# Patient Record
Sex: Female | Born: 1975 | Race: Asian | Hispanic: No | Marital: Married | Smoking: Never smoker
Health system: Southern US, Community
[De-identification: ages and names within clinical notes are randomized; demographics above are authoritative.]

## PROBLEM LIST (undated history)

## (undated) ENCOUNTER — Inpatient Hospital Stay (HOSPITAL_COMMUNITY): Payer: Self-pay

## (undated) DIAGNOSIS — Z789 Other specified health status: Secondary | ICD-10-CM

## (undated) DIAGNOSIS — R079 Chest pain, unspecified: Secondary | ICD-10-CM

## (undated) DIAGNOSIS — O24419 Gestational diabetes mellitus in pregnancy, unspecified control: Secondary | ICD-10-CM

## (undated) HISTORY — PX: DILATION AND CURETTAGE OF UTERUS: SHX78

## (undated) HISTORY — DX: Chest pain, unspecified: R07.9

---

## 2012-09-29 ENCOUNTER — Inpatient Hospital Stay (HOSPITAL_COMMUNITY)
Admission: AD | Admit: 2012-09-29 | Discharge: 2012-09-29 | Disposition: A | Discharge status: Home / Self Care | Payer: Medicaid Other | Source: Ambulatory Visit | Attending: Obstetrics & Gynecology | Admitting: Obstetrics & Gynecology

## 2012-09-29 ENCOUNTER — Inpatient Hospital Stay (HOSPITAL_COMMUNITY): Payer: Medicaid Other

## 2012-09-29 ENCOUNTER — Encounter (HOSPITAL_COMMUNITY): Payer: Self-pay | Admitting: *Deleted

## 2012-09-29 ENCOUNTER — Inpatient Hospital Stay (HOSPITAL_COMMUNITY)
Admission: AD | Admit: 2012-09-29 | Discharge: 2012-09-30 | Disposition: A | Discharge status: Home / Self Care | Payer: Medicaid Other | Source: Ambulatory Visit | Attending: Obstetrics & Gynecology | Admitting: Obstetrics & Gynecology

## 2012-09-29 ENCOUNTER — Encounter (HOSPITAL_COMMUNITY): Payer: Self-pay

## 2012-09-29 DIAGNOSIS — R1031 Right lower quadrant pain: Secondary | ICD-10-CM | POA: Insufficient documentation

## 2012-09-29 DIAGNOSIS — R1032 Left lower quadrant pain: Secondary | ICD-10-CM | POA: Insufficient documentation

## 2012-09-29 DIAGNOSIS — R109 Unspecified abdominal pain: Secondary | ICD-10-CM | POA: Insufficient documentation

## 2012-09-29 DIAGNOSIS — O209 Hemorrhage in early pregnancy, unspecified: Secondary | ICD-10-CM | POA: Insufficient documentation

## 2012-09-29 DIAGNOSIS — O039 Complete or unspecified spontaneous abortion without complication: Secondary | ICD-10-CM

## 2012-09-29 DIAGNOSIS — O210 Mild hyperemesis gravidarum: Secondary | ICD-10-CM | POA: Insufficient documentation

## 2012-09-29 DIAGNOSIS — O26859 Spotting complicating pregnancy, unspecified trimester: Secondary | ICD-10-CM | POA: Insufficient documentation

## 2012-09-29 DIAGNOSIS — O4690 Antepartum hemorrhage, unspecified, unspecified trimester: Secondary | ICD-10-CM

## 2012-09-29 LAB — WET PREP, GENITAL: Yeast Wet Prep HPF POC: NONE SEEN

## 2012-09-29 LAB — URINALYSIS, ROUTINE W REFLEX MICROSCOPIC
Ketones, ur: NEGATIVE mg/dL
Protein, ur: NEGATIVE mg/dL
Urobilinogen, UA: 0.2 mg/dL (ref 0.0–1.0)

## 2012-09-29 LAB — CBC
HCT: 34.7 % — ABNORMAL LOW (ref 36.0–46.0)
HCT: 34.4 % — ABNORMAL LOW (ref 36.0–46.0)
Hemoglobin: 11.8 g/dL — ABNORMAL LOW (ref 12.0–15.0)
MCH: 31.6 pg (ref 26.0–34.0)
MCHC: 34.3 g/dL (ref 30.0–36.0)
MCV: 92 fL (ref 78.0–100.0)
MCV: 92 fL (ref 78.0–100.0)
Platelets: 269 10*3/uL (ref 150–400)
RBC: 3.74 MIL/uL — ABNORMAL LOW (ref 3.87–5.11)
RDW: 11.9 % (ref 11.5–15.5)
WBC: 4.8 10*3/uL (ref 4.0–10.5)

## 2012-09-29 LAB — URINE MICROSCOPIC-ADD ON

## 2012-09-29 MED ORDER — ONDANSETRON HCL 4 MG/2ML IJ SOLN
4.0000 mg | Freq: Once | INTRAMUSCULAR | Status: AC
  Administered 2012-09-30: 4 mg via INTRAVENOUS
  Filled 2012-09-29: qty 2

## 2012-09-29 MED ORDER — SODIUM CHLORIDE 0.9 % IV SOLN
INTRAVENOUS | Status: DC
  Administered 2012-09-30: via INTRAVENOUS

## 2012-09-29 NOTE — Progress Notes (Signed)
Pt became nauseous after pelvic exam

## 2012-09-29 NOTE — MAU Note (Addendum)
Patient is in with c/o vaginal bleeding (dark brown) for 3 days. She is tearfu and concerned because she had a 7 weeks miscarriage last year. She denies any pain. Patient does not have a pad on, she states that she see the blood after voiding.

## 2012-09-29 NOTE — MAU Note (Signed)
Patient states she has had brown spotting for several days. [facility]. Denies any pain.

## 2012-09-29 NOTE — MAU Note (Signed)
Pt states bleeding became worse today about 1900.

## 2012-09-29 NOTE — MAU Provider Note (Signed)
History     CSN: [id number]  Arrival date and time: 09/29/12 1053   First Provider Initiated Contact with Patient 09/29/12 1144      Chief Complaint  Patient presents with  . Vaginal Bleeding   HPI  Savannah Edwards is a 36 y.o. G2P0010 who is [redacted]w[redacted]d presenting with vaginal spotting that started 3 days ago. She has intermittent RLQ and LLQ cramping that is relieved with rest. Denies vaginal irritation or discharge, urinary symptoms, fever. She had a miscarriage with her previous pregnancy at [redacted] weeks gestation.    History reviewed. No pertinent past medical history.  History reviewed. No pertinent past surgical history.  History reviewed. No pertinent family history.  History  Substance Use Topics  . Smoking status: Never Smoker   . Smokeless tobacco: Not on file  . Alcohol Use: No    Allergies: No Known Allergies  Prescriptions prior to admission  Medication Sig Dispense Refill  . Prenatal Vit-Fe Fumarate-FA (PRENATAL MULTIVITAMIN) TABS Take 1 tablet by mouth daily.        Review of Systems  Constitutional: Negative for fever and chills.  Gastrointestinal: Positive for nausea, vomiting and abdominal pain. Negative for diarrhea and blood in stool.  Genitourinary: Positive for frequency. Negative for dysuria, urgency, hematuria and flank pain.  Neurological: Negative for weakness and headaches.   Physical Exam   Blood pressure 127/86, pulse 98, temperature 98.5 F (36.9 C), temperature source Oral, resp. rate 16, height 5' (1.524 m), weight 54.795 kg (120 lb 12.8 oz), last menstrual period 08/19/2012, SpO2 100.00%.  Physical Exam  Constitutional: She is oriented to person, place, and time. She appears well-developed and well-nourished. No distress.  Cardiovascular: Normal rate, regular rhythm and normal heart sounds.   Respiratory: Effort normal and breath sounds normal.  GI: Soft. Bowel sounds are normal. She exhibits no mass. There is no tenderness. There is no  rebound.  Genitourinary: Vagina normal and uterus normal. Uterus is not tender. Cervix exhibits discharge (bloody). Right adnexum displays no tenderness. Left adnexum displays no tenderness. No tenderness around the vagina. No vaginal discharge found.  Neurological: She is alert and oriented to person, place, and time.  Skin: Skin is warm and dry. She is not diaphoretic.  Psychiatric: She has a normal mood and affect. Her behavior is normal.    MAU Course  Procedures  Results for orders placed during the hospital encounter of 09/29/12 (from the past 24 hour(s))  POCT PREGNANCY, URINE     Status: Abnormal   Collection Time   09/29/12 11:29 AM      Component Value Range   Preg Test, Ur POSITIVE (*) NEGATIVE  CBC     Status: Abnormal   Collection Time   09/29/12 11:50 AM      Component Value Range   WBC 4.8  4.0 - 10.5 K/uL   RBC 3.77 (*) 3.87 - 5.11 MIL/uL   Hemoglobin 11.9 (*) 12.0 - 15.0 g/dL   HCT 40.9 (*) 81.1 - 91.4 %   MCV 92.0  78.0 - 100.0 fL   MCH 31.6  26.0 - 34.0 pg   MCHC 34.3  30.0 - 36.0 g/dL   RDW 78.2  95.6 - 21.3 %   Platelets 242  150 - 400 K/uL  ABO/RH     Status: Normal   Collection Time   09/29/12 11:50 AM      Component Value Range   ABO/RH(D) B POS    HCG, QUANTITATIVE, PREGNANCY  Status: Abnormal   Collection Time   09/29/12 11:50 AM      Component Value Range   hCG, Beta Chain, Quant, S 67 (*) <5 mIU/mL   Radiology report: no IUP or adenaxal mass identified.  Wet prep- pending; will review with pt when returns for HCG on 11/29 Assessment and Plan  IUP too early to identify vs anembryonic pregnancy vs SAB vs ectopic pregnancy  Return on Thursday morning to repeat labs Return sooner for increased bleeding or abdominal pain  Corky Downs 09/29/2012, 11:50 AM  I examined pt  And agree with above note

## 2012-09-30 LAB — GC/CHLAMYDIA PROBE AMP: CT Probe RNA: NEGATIVE

## 2012-09-30 MED ORDER — IBUPROFEN 600 MG PO TABS: 600.0000 mg | ORAL_TABLET | Freq: Once | ORAL | Status: DC

## 2012-09-30 MED ORDER — IBUPROFEN 600 MG PO TABS: 600.0000 mg | ORAL_TABLET | Freq: Four times a day (QID) | ORAL | Status: DC | PRN

## 2012-09-30 NOTE — MAU Note (Signed)
Pt tearful, second miscarriage, pt asking about reasons for miscarrying

## 2012-09-30 NOTE — MAU Provider Note (Signed)
History     CSN: [id number]  Arrival date and time: 09/29/12 2312   First Provider Initiated Contact with Patient 09/29/12 2354      Chief Complaint  Patient presents with  . Vaginal Bleeding  . Abdominal Pain   HPI Savannah Edwards is a 36 y.o. female who presents to MAU with vaginal bleeding.  She was evaluated here earlier today and had a Bhcg that was 67. She went home and just prior to arrival to MAU began having heavy bleeding and severe cramping. Initially the pain was 8/10 but now 2/10. The pain comes and goes. She also has nausea and vomiting. The history was provided by the patient.  OB History    Grav Para Term Preterm Abortions TAB SAB Ect Mult Living   2    1  1          History reviewed. No pertinent past medical history.  History reviewed. No pertinent past surgical history.  Family History  Problem Relation Age of Onset  . Alcohol abuse Neg Hx   . Arthritis Neg Hx   . Asthma Neg Hx   . Birth defects Neg Hx   . Cancer Neg Hx   . COPD Neg Hx   . Depression Neg Hx   . Diabetes Neg Hx   . Drug abuse Neg Hx   . Early death Neg Hx   . Hearing loss Neg Hx   . Heart disease Neg Hx   . Hyperlipidemia Neg Hx   . Hypertension Neg Hx   . Kidney disease Neg Hx   . Learning disabilities Neg Hx   . Mental illness Neg Hx   . Mental retardation Neg Hx   . Miscarriages / Stillbirths Neg Hx   . Stroke Neg Hx   . Vision loss Neg Hx     History  Substance Use Topics  . Smoking status: Never Smoker   . Smokeless tobacco: Not on file  . Alcohol Use: No    Allergies: No Known Allergies  Prescriptions prior to admission  Medication Sig Dispense Refill  . Prenatal Vit-Fe Fumarate-FA (PRENATAL MULTIVITAMIN) TABS Take 1 tablet by mouth daily.        ROS: as stated in HIP Physical Exam   Blood pressure 110/81, pulse 91, temperature 97.8 F (36.6 C), temperature source Oral, resp. rate 18, last menstrual period 08/19/2012, SpO2 100.00%.  Physical Exam  Nursing  note and vitals reviewed. Constitutional: She is oriented to person, place, and time. She appears well-developed and well-nourished. No distress.  HENT:  Head: Normocephalic and atraumatic.  Eyes: EOM are normal.  Neck: Neck supple.  Cardiovascular: Normal rate.   Respiratory: Effort normal.  GI: Soft. There is no tenderness.       Unable to reproduce the pain that the patient has had.  Genitourinary:       External genitalia without lesions. Small blood vaginal vault. Cervix closed, long, no CMT, no adnexal tenderness. Uterus without palpable enlargement.   Musculoskeletal: Normal range of motion.  Neurological: She is alert and oriented to person, place, and time.  Skin: Skin is warm and dry.  Psychiatric: She has a normal mood and affect. Her behavior is normal. Judgment and thought content normal.   Results for orders placed during the hospital encounter of 09/29/12 (from the past 24 hour(s))  CBC     Status: Abnormal   Collection Time   09/29/12 11:25 PM      Component Value Range   WBC  7.7  4.0 - 10.5 K/uL   RBC 3.74 (*) 3.87 - 5.11 MIL/uL   Hemoglobin 11.8 (*) 12.0 - 15.0 g/dL   HCT 14.7 (*) 82.9 - 56.2 %   MCV 92.0  78.0 - 100.0 fL   MCH 31.6  26.0 - 34.0 pg   MCHC 34.3  30.0 - 36.0 g/dL   RDW 13.0 (*) 86.5 - 78.4 %   Platelets 269  150 - 400 K/uL  HCG, QUANTITATIVE, PREGNANCY     Status: Abnormal   Collection Time   09/29/12 11:25 PM      Component Value Range   hCG, Beta Chain, Quant, S 43 (*) <5 mIU/mL   Assessment: 36 y.o. female with vaginal bleeding and cramping in early pregnancy   Drop in Bhcg   SAB   Nausea and vomiting   Plan:  IV fluids   Zofran 4 mg IV   Follow up in 48 hours, return sooner for problems. MAU Course: discussed with Dr. Debroah Loop  Procedures Discussed with the patient and all questioned fully answered. She will return if any problems arise.   Medication List     As of 09/30/2012 12:41 AM    CONTINUE taking these medications          prenatal multivitamin Tabs         NEESE,HOPE, RN, FNP, [facility] 09/30/2012, 12:28 AM

## 2012-09-30 NOTE — MAU Note (Signed)
Pt very tearful still has questions about why she is having 2nd miscarriage

## 2012-10-02 ENCOUNTER — Encounter (HOSPITAL_COMMUNITY): Payer: Self-pay | Admitting: *Deleted

## 2012-10-02 ENCOUNTER — Inpatient Hospital Stay (HOSPITAL_COMMUNITY)
Admission: AD | Admit: 2012-10-02 | Discharge: 2012-10-02 | Disposition: A | Discharge status: Home / Self Care | Payer: Medicaid Other | Source: Ambulatory Visit | Attending: Family Medicine | Admitting: Family Medicine

## 2012-10-02 DIAGNOSIS — O039 Complete or unspecified spontaneous abortion without complication: Secondary | ICD-10-CM | POA: Insufficient documentation

## 2012-10-02 HISTORY — DX: Other specified health status: Z78.9

## 2012-10-02 NOTE — MAU Provider Note (Signed)
  History     CSN: [id number]  Arrival date and time: 10/02/12 1652   None     No chief complaint on file.  HPI This is a 36 y.o. female at [redacted]w[redacted]d by LMP who presents for followup Quant HCG.  She was seen twice on the 26th for bleeding. Her Quant HCG levels dropped the same day from 67 to 43.  She also had a miscarriage a year ago.                       OB History    Grav Para Term Preterm Abortions TAB SAB Ect Mult Living   2    1  1          No past medical history on file.  No past surgical history on file.  Family History  Problem Relation Age of Onset  . Alcohol abuse Neg Hx   . Arthritis Neg Hx   . Asthma Neg Hx   . Birth defects Neg Hx   . Cancer Neg Hx   . COPD Neg Hx   . Depression Neg Hx   . Diabetes Neg Hx   . Drug abuse Neg Hx   . Early death Neg Hx   . Hearing loss Neg Hx   . Heart disease Neg Hx   . Hyperlipidemia Neg Hx   . Hypertension Neg Hx   . Kidney disease Neg Hx   . Learning disabilities Neg Hx   . Mental illness Neg Hx   . Mental retardation Neg Hx   . Miscarriages / Stillbirths Neg Hx   . Stroke Neg Hx   . Vision loss Neg Hx     History  Substance Use Topics  . Smoking status: Never Smoker   . Smokeless tobacco: Not on file  . Alcohol Use: No    Allergies: No Known Allergies  Prescriptions prior to admission  Medication Sig Dispense Refill  . ibuprofen (ADVIL,MOTRIN) 600 MG tablet Take 1 tablet (600 mg total) by mouth every 6 (six) hours as needed for pain.  30 tablet  0  . Prenatal Vit-Fe Fumarate-FA (PRENATAL MULTIVITAMIN) TABS Take 1 tablet by mouth daily.        ROS Negative  Physical Exam   Last menstrual period 08/19/2012.  Physical Exam  Constitutional: She is oriented to person, place, and time. She appears well-developed and well-nourished. No distress.  Neurological: She is alert and oriented to person, place, and time.  Skin: Skin is warm and dry.  Psychiatric: She has a normal mood and affect.   Exam not  indicated  MAU Course  Procedures  MDM Results for orders placed during the hospital encounter of 10/02/12 (from the past 24 hour(s))  HCG, QUANTITATIVE, PREGNANCY     Status: Abnormal   Collection Time   10/02/12  5:02 PM      Component Value Range   hCG, Beta Chain, Quant, S 9 (*) <5 mIU/mL     Assessment and Plan  A:  SAB with declining quant HCG levels  P:  Discharge      Follow up in clinic      Discussed we usually dont do a full workup until 3 miscarriages..will discuss inclinic      Continue PNV  [facility] 10/02/2012, 5:05 PM

## 2012-10-02 NOTE — MAU Note (Signed)
Doing ok, still bleeding,only time she has pain is when she passes a clot.

## 2012-10-02 NOTE — MAU Provider Note (Signed)
Chart reviewed and agree with management and plan.  

## 2012-10-05 NOTE — MAU Provider Note (Signed)
Attestation of Attending Supervision of Advanced Practitioner (CNM/NP): Evaluation and management procedures were performed by the Advanced Practitioner under my supervision and collaboration.  I have reviewed the Advanced Practitioner's note and chart, and I agree with the management and plan.  HARRAWAY-SMITH, Malina Geers 10:38 AM     

## 2013-02-02 ENCOUNTER — Telehealth: Payer: Self-pay | Admitting: *Deleted

## 2013-02-02 DIAGNOSIS — B9689 Other specified bacterial agents as the cause of diseases classified elsewhere: Secondary | ICD-10-CM

## 2013-02-02 MED ORDER — TINIDAZOLE 500 MG PO TABS: 1000.0000 mg | ORAL_TABLET | Freq: Every day | ORAL | Status: AC

## 2013-02-02 NOTE — Telephone Encounter (Signed)
Patient was notified by office of lab results- language resources called for pharmacy preference. [facility] to be called to pharmacy for patient.

## 2013-07-19 ENCOUNTER — Ambulatory Visit (INDEPENDENT_AMBULATORY_CARE_PROVIDER_SITE_OTHER): Payer: Medicaid Other | Admitting: Obstetrics

## 2013-07-19 ENCOUNTER — Encounter: Payer: Self-pay | Admitting: Obstetrics

## 2013-07-19 VITALS — BP 105/71 | HR 77 | Temp 98.7°F | Ht 61.0 in | Wt 124.0 lb

## 2013-07-19 DIAGNOSIS — O2620 Pregnancy care for patient with recurrent pregnancy loss, unspecified trimester: Secondary | ICD-10-CM

## 2013-07-19 NOTE — Progress Notes (Signed)
Subjective:    Savannah Edwards is a 37 y.o. female who presents for evaluation of infertility. Patient and partner have been attempting conception for 8-9 months  Marital status: married for 10 years. Pregnancies with current partner: yes.  Menstrual and Endocrine History LMP Patient's last menstrual period was 06/28/2012.  Menarche age 61-14?  Shortest interval 30  Longest interval not applicable  days  Duration of flow 4 days  Heavy menses yes  Clots yes  Intermenstrual bleeding no  Postcoital bleeding no  Dysmenorrhea yes  Amenorrhea not applicable  Weight change no  Hirsutism no  Balding yes  Acne yes  Galactorrhea yes   Obstetrical History History: history of 3 miscarriages with most recent November 2013  Gynecologic History Last PAP Unknown  Previous abdominal or pelvic surgery D & C with second miscarriage  Pelvic pain no  Endometriosis no  Hot flashes no  DES exposure no  Abnormal Pap no  Cervix Cryo/cone no  Sexually transmitted diseases no  Pelvic inflammatory disease no   Infertility and Endocrine Studies Basal body temperature no  Endo with biopsy no  Hysterosalpingogram no  Post-coital test no  Laparoscopy no  Hormonal studies no  Semen analysis no  Other studies no  Medications none  Other therapies Not applicable  Insemination not applicable   Sexual History Frequency 3 times per week  Satisfied yes  Dyspareunia no  Use of lubricant no  Douching no  Number of lifetime sex partners 1   Contraception None  Family History Thyroid problems  no  Heart condition or high blood pressure  yes  Blood clot or stroke  no  Diabetes  no  Cancer  no  Birth defects/inherited diseases  no  Infectious diseases (mumps, TB, rubella)  no  Other medical problems  no   Habits Cigarettes:    Wife -  no    Husband - no Alcohol:    Wife -  no    Husband - yes, 10 -14 drinks per week for 19 years Marijuana:   Wife - no   Husband - no  The following  portions of the patient's history were reviewed and updated as appropriate: allergies, current medications, past family history, past medical history, past social history, past surgical history and problem list.  Review of Systems Pertinent items are noted in HPI.  Female History and Exam Name:   Renelda Mom Age: 52 Education: 2 YR Degree  Marital History: married # of years with this partner: 43 # of partners: Unknown  Paternity of Pregnancies: Number with this partner: 0 Number with other partners: 0 Age of youngest child: not applicable  Urologic History: Infection no  STD no  Mumps no  Varicocele no  Semen analysis no  Undescended testes no  Testicular trauma no  Genital surgery no  Ejaculatory problem no  Impotence no         Objective:    Female Exam BP 105/71  Pulse 77  Temp(Src) 98.7 F (37.1 C) (Oral)  Ht 5\' 1"  (1.549 m)  Wt 124 lb (56.246 kg)  BMI 23.44 kg/m2  LMP 06/28/2012  Breastfeeding? No Wt Readings from Last 1 Encounters:  07/19/13 124 lb (56.246 kg)   BMI: Body mass index is 23.44 kg/(m^2). No exam performed today, Infertility consultation visit..    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   Plan:    All questions answered. Referred to Reproductive Endocrinology for recurrent pregnancy losses.

## 2013-07-20 ENCOUNTER — Encounter: Payer: Self-pay | Admitting: Obstetrics

## 2013-08-09 ENCOUNTER — Encounter: Payer: Self-pay | Admitting: Obstetrics

## 2013-11-04 NOTE — L&D Delivery Note (Signed)
Delivery Note At 12:45 AM a viable female was delivered via Vaginal, Vacuum (Extractor) because of OP position and maternal exhaustion. (Presentation: Left Occiput Posterior).  APGAR: 9, 9; weight:  2809 grams .   Placenta status: Intact, Spontaneous.  Cord:  with the following complications: None.  Cord pH: none  Anesthesia: Epidural  Episiotomy: Midline Lacerations: 2nd degree Suture Repair: 2.0 chromic vicryl Est. Blood Loss (mL): 400  Mom to postpartum.  Baby to Couplet care / Skin to Skin.  Nettie Wyffels A 07/09/2014, 1:23 AM

## 2013-11-12 ENCOUNTER — Encounter: Payer: Medicaid Other | Admitting: Advanced Practice Midwife

## 2013-11-17 ENCOUNTER — Encounter: Payer: Self-pay | Admitting: Obstetrics

## 2013-11-17 ENCOUNTER — Ambulatory Visit (INDEPENDENT_AMBULATORY_CARE_PROVIDER_SITE_OTHER): Payer: 59 | Admitting: Obstetrics

## 2013-11-17 VITALS — BP 98/73 | Temp 97.4°F | Wt 130.0 lb

## 2013-11-17 DIAGNOSIS — Z3400 Encounter for supervision of normal first pregnancy, unspecified trimester: Secondary | ICD-10-CM

## 2013-11-17 DIAGNOSIS — Z3201 Encounter for pregnancy test, result positive: Secondary | ICD-10-CM

## 2013-11-17 DIAGNOSIS — O09529 Supervision of elderly multigravida, unspecified trimester: Secondary | ICD-10-CM | POA: Insufficient documentation

## 2013-11-17 DIAGNOSIS — O2620 Pregnancy care for patient with recurrent pregnancy loss, unspecified trimester: Secondary | ICD-10-CM

## 2013-11-17 DIAGNOSIS — Z113 Encounter for screening for infections with a predominantly sexual mode of transmission: Secondary | ICD-10-CM

## 2013-11-17 LAB — POCT URINALYSIS DIPSTICK
Bilirubin, UA: NEGATIVE
Glucose, UA: NEGATIVE
Ketones, UA: NEGATIVE
NITRITE UA: NEGATIVE
PH UA: 5
RBC UA: NEGATIVE
Spec Grav, UA: 1.02
Urobilinogen, UA: NEGATIVE

## 2013-11-17 LAB — OB RESULTS CONSOLE GC/CHLAMYDIA
CHLAMYDIA, DNA PROBE: NEGATIVE
Gonorrhea: NEGATIVE

## 2013-11-17 NOTE — Progress Notes (Signed)
P 84 Patient states she had some spotting after intercourse last week - but nothing since. Patient is very concerned about her pregnancy and her history of miscarriage Subjective:    Savannah Edwards is being seen today for her first obstetrical visit.  This is a planned pregnancy. She is at 6956w6d gestation. Her obstetrical history is significant for advanced maternal age and poor pregnancy history. Relationship with FOB: spouse, living together. Patient does intend to breast feed. Pregnancy history fully reviewed.  Menstrual History: OB History   Grav Para Term Preterm Abortions TAB SAB Ect Mult Living   4    3  3          Menarche age: [age] Patient's last menstrual period was 09/23/2013.    The following portions of the patient's history were reviewed and updated as appropriate: allergies, current medications, past family history, past medical history, past social history, past surgical history and problem list.  Review of Systems Pertinent items are noted in HPI.    Objective:    General appearance: alert and no distress Abdomen: normal findings: soft, non-tender Pelvic: cervix normal in appearance, external genitalia normal, no adnexal masses or tenderness, no cervical motion tenderness, rectovaginal septum normal, uterus normal size, shape, and consistency and vagina normal without discharge Extremities: extremities normal, atraumatic, no cyanosis or edema    Assessment:    Pregnancy at 3656w6d weeks   Habitual Aborter   Plan:    Initial labs drawn. Prenatal vitamins.  Counseling provided regarding continued use of seat belts, cessation of alcohol consumption, smoking or use of illicit drugs; infection precautions i.e., influenza/TDAP immunizations, toxoplasmosis,CMV, parvovirus, listeria and varicella; workplace safety, exercise during pregnancy; routine dental care, safe medications, sexual activity, hot tubs, saunas, pools, travel, caffeine use, fish and methlymercury, potential  toxins, hair treatments, varicose veins Weight gain recommendations per IOM guidelines reviewed: underweight/BMI< 18.5--> gain 28 - 40 lbs; normal weight/BMI 18.5 - 24.9--> gain 25 - 35 lbs; overweight/BMI 25 - 29.9--> gain 15 - 25 lbs; obese/BMI >30->gain  11 - 20 lbs Problem list reviewed and updated. FIRST/CF mutation testing/NIPT/QUAD SCREEN discussed: requested. Role of ultrasound in pregnancy discussed; fetal survey: requested. Amniocentesis discussed: undecided. VBAC calculator score: VBAC consent form provided Follow up in 2 weeks. 50% of 20 min visit spent on counseling and coordination of care.  Referred to MFM. Start Progesterone suppositories after ultrasound confirms viable fetus. Draw antiphospholipid labs today. .Marland Kitchen

## 2013-11-18 ENCOUNTER — Encounter: Payer: Self-pay | Admitting: Obstetrics

## 2013-11-18 LAB — OBSTETRIC PANEL
ANTIBODY SCREEN: NEGATIVE
BASOS ABS: 0 10*3/uL (ref 0.0–0.1)
BASOS PCT: 0 % (ref 0–1)
Eosinophils Absolute: 0 10*3/uL (ref 0.0–0.7)
Eosinophils Relative: 1 % (ref 0–5)
HCT: 37.7 % (ref 36.0–46.0)
HEP B S AG: NEGATIVE
Hemoglobin: 12.9 g/dL (ref 12.0–15.0)
Lymphocytes Relative: 26 % (ref 12–46)
Lymphs Abs: 2 10*3/uL (ref 0.7–4.0)
MCH: 31.8 pg (ref 26.0–34.0)
MCHC: 34.2 g/dL (ref 30.0–36.0)
MCV: 92.9 fL (ref 78.0–100.0)
Monocytes Absolute: 0.4 10*3/uL (ref 0.1–1.0)
Monocytes Relative: 5 % (ref 3–12)
NEUTROS ABS: 5.3 10*3/uL (ref 1.7–7.7)
NEUTROS PCT: 68 % (ref 43–77)
PLATELETS: 313 10*3/uL (ref 150–400)
RBC: 4.06 MIL/uL (ref 3.87–5.11)
RDW: 13.8 % (ref 11.5–15.5)
Rh Type: POSITIVE
Rubella: 5.36 Index — ABNORMAL HIGH (ref <0.90)
WBC: 7.7 10*3/uL (ref 4.0–10.5)

## 2013-11-18 LAB — CULTURE, OB URINE
Colony Count: NO GROWTH
ORGANISM ID, BACTERIA: NO GROWTH

## 2013-11-18 LAB — VITAMIN D 25 HYDROXY (VIT D DEFICIENCY, FRACTURES): VIT D 25 HYDROXY: 25 ng/mL — AB (ref 30–89)

## 2013-11-18 LAB — WET PREP BY MOLECULAR PROBE
Candida species: NEGATIVE
Gardnerella vaginalis: NEGATIVE
Trichomonas vaginosis: NEGATIVE

## 2013-11-18 LAB — BETA-2 GLYCOPROTEIN ANTIBODIES
BETA 2 GLYCO I IGG: 3 G Units (ref <20)
BETA-2-GLYCOPROTEIN I IGM: 36 M Units — AB (ref <20)
Beta-2-Glycoprotein I IgA: 15 A Units (ref <20)

## 2013-11-18 LAB — GC/CHLAMYDIA PROBE AMP
CT PROBE, AMP APTIMA: NEGATIVE
GC Probe RNA: NEGATIVE

## 2013-11-18 LAB — CARDIOLIPIN ANTIBODIES, IGG, IGM, IGA
ANTICARDIOLIPIN IGG: 16 GPL U/mL (ref <23)
ANTICARDIOLIPIN IGM: 9 [MPL'U]/mL (ref <11)
Anticardiolipin IgA: 8 APL U/mL (ref <22)

## 2013-11-18 LAB — HIV ANTIBODY (ROUTINE TESTING W REFLEX): HIV: NONREACTIVE

## 2013-11-19 LAB — HEMOGLOBINOPATHY EVALUATION
HEMOGLOBIN OTHER: 0 %
Hgb A2 Quant: 2.4 % (ref 2.2–3.2)
Hgb A: 97.6 % (ref 96.8–97.8)
Hgb F Quant: 0 % (ref 0.0–2.0)
Hgb S Quant: 0 %

## 2013-11-24 ENCOUNTER — Other Ambulatory Visit: Payer: Self-pay | Admitting: Obstetrics

## 2013-11-24 DIAGNOSIS — O2620 Pregnancy care for patient with recurrent pregnancy loss, unspecified trimester: Secondary | ICD-10-CM

## 2013-11-24 DIAGNOSIS — O09529 Supervision of elderly multigravida, unspecified trimester: Secondary | ICD-10-CM

## 2013-11-25 ENCOUNTER — Encounter: Payer: Self-pay | Admitting: Obstetrics & Gynecology

## 2013-11-26 ENCOUNTER — Ambulatory Visit (HOSPITAL_COMMUNITY)
Admission: RE | Admit: 2013-11-26 | Discharge: 2013-11-26 | Disposition: A | Discharge status: Home / Self Care | Payer: 59 | Source: Ambulatory Visit | Attending: Obstetrics | Admitting: Obstetrics

## 2013-11-26 DIAGNOSIS — O2620 Pregnancy care for patient with recurrent pregnancy loss, unspecified trimester: Secondary | ICD-10-CM

## 2013-11-26 DIAGNOSIS — O09529 Supervision of elderly multigravida, unspecified trimester: Secondary | ICD-10-CM

## 2013-11-26 NOTE — Consult Note (Signed)
Maternal Fetal Medicine Consultation  Requesting Provider(s): Savannah Ceoharles Harper, MD  Reason for consultation: Advanced maternal age, hx of recurrent pregnancy loss  HPI: Savannah Edwards is a 38 yo G4P0030 currently at 439 1/7 weeks by dates, EDD 06/30/2014 who was seen today due to recurrent pregnancy loss. Her past OB history is remarkable for 3 previous, consecutive early losses all < 7 weeks.  She reports that with her second pregnancy, fetal heart tones were documented.  She was later noted to have an early demise and required D&C.  Savannah Edwards started a work up with REI prior to pregnancy - had normal karyotypes with both parents.  Antiphospholipid antibody work up showed a low positive beta2 glycoprotein antibodies.  Lupus anticoagulant and anticardiolipin antibodies were within normal limits.  The patient today is without complaints and her pregnancy thus far has otherwise been uncomplicated.  OB History: OB History   Grav Para Term Preterm Abortions TAB SAB Ect Mult Living   4    3  3          PMH:  Past Medical History  Diagnosis Date  . No pertinent past medical history     PSH:  Past Surgical History  Procedure Laterality Date  . Dilation and curettage of uterus     Meds: Prenatal vitamins  Allergies: No Known Allergies  FH:  Family History  Problem Relation Age of Onset  . Alcohol abuse Neg Hx   . Arthritis Neg Hx   . Asthma Neg Hx   . Birth defects Neg Hx   . COPD Neg Hx   . Depression Neg Hx   . Diabetes Neg Hx   . Drug abuse Neg Hx   . Early death Neg Hx   . Hearing loss Neg Hx   . Heart disease Neg Hx   . Hyperlipidemia Neg Hx   . Kidney disease Neg Hx   . Learning disabilities Neg Hx   . Mental illness Neg Hx   . Mental retardation Neg Hx   . Miscarriages / Stillbirths Neg Hx   . Stroke Neg Hx   . Vision loss Neg Hx   . Other Neg Hx   . Hypertension Mother   . Cancer Paternal Uncle    Soc:  History  Smoking status  . Never Smoker   Smokeless tobacco  .  Never Used   History  Alcohol Use No    Review of Systems: no vaginal bleeding or cramping/contractions, no LOF, no nausea/vomiting. All other systems reviewed and are negative.   PE:  There were no vitals filed for this visit.  GEN: well-appearing female ABD: gravid, NT  Please see separate document for fetal ultrasound report.  Labs: CBC    Component Value Date/Time   WBC 7.7 11/17/2013 1331   RBC 4.06 11/17/2013 1331   HGB 12.9 11/17/2013 1331   HCT 37.7 11/17/2013 1331   PLT 313 11/17/2013 1331   MCV 92.9 11/17/2013 1331   MCH 31.8 11/17/2013 1331   MCHC 34.2 11/17/2013 1331   RDW 13.8 11/17/2013 1331   LYMPHSABS 2.0 11/17/2013 1331   MONOABS 0.4 11/17/2013 1331   EOSABS 0.0 11/17/2013 1331   BASOSABS 0.0 11/17/2013 1331   Beta-2 Glyco I IgG 3                        <20 G Units  Beta-2-Glycoprotein I IgM 36 (H)    <20 M Units  Beta-2-Glycoprotein I IgA 15           <  20 A Units Final   A/P: 1) Single IUP at 9 1/7 weeks by dates         2) Advanced maternal age - see separate Genetics Counseling note.  Would offer first trimester screen and detailed ultrasound at 18 weeks for fetal anatomy         3) Recurrent pregnancy loss - the patient previously started the work up for RPL - had normal karyotypes and antiphospholipid antibody screen is negative except for IgM beta2 glycoprotein antibodies.  In light of normal fetal heart tones today, would overall offer a favorable prognosis, and in general the likelihood of a liveborn infant after a normal and abnormal diagnostic evaluations for RPL are 77% and 71% respectively.  Recommend: 1) We discussed progesterone supplementation to support the pregnancy during the first trimester - which may or may not be of any benefit.  The patient was given a prescription for Crinone gel - if she elects to use it, would use it through the first trimeter (12 weeks) 2) Beta2 glycoproteins IgM positive - one episode.  To make the full diagnosis of  antiphospholipid antibody syndrome, a second positive test would be required at least in a 6 week interval.  The diagnosis requires both a clinical and laboratory abnormalities.   The history of 3 prior losses qualifies for clinical criteria.  The patient does not have any prior history of thromboembolism.  At this point, would treat with a daily baby aspirin, but would also recommend that repeat labs be performed in 6 weeks.  If the repeat labs are again positive, would recommend a follow up consultation with MFM - would entertain adding Lovenox in addition to the baby aspirin as well as serial growth scans in the 3rd trimester and antepartum fetal testing beginning at 32 weeks.   Thank you for the opportunity to be a part of the care of Savannah Edwards. Please contact our office if we can be of further assistance.   I spent approximately 30 minutes with this patient with over 50% of time spent in face-to-face counseling.  Alpha Gula, MD Maternal Fetal Medicine

## 2013-11-26 NOTE — ED Notes (Signed)
Appointment Date:  DOB: April 24, 1976 Referring Provider: Brock Bad, MD Attending: Particia Nearing, MD  Savannah Edwards and her husband, Savannah Edwards, were seen for genetic counseling because of a maternal age of 38 y.o.Marland Kitchen     They were counseled regarding maternal age and the association with risk for chromosome conditions due to nondisjunction with aging of the ova.   We reviewed chromosomes, nondisjunction, and the associated 1 in 114 risk for fetal aneuploidy related to a maternal age of 38 y.o. at [redacted]w[redacted]d gestation.  They were counseled that the risk for aneuploidy decreases as gestational age increases, accounting for those pregnancies which spontaneously abort.  We specifically discussed Down syndrome (trisomy 58), trisomies 35 and 60, and sex chromosome aneuploidies (47,XXX and 47,XXY) including the common features and prognoses of each.   We reviewed available screening options including First Screen, noninvasive prenatal screening (NIPS)/cell free fetal DNA (cffDNA) testing, and detailed ultrasound.  They were counseled that screening tests are used to modify a patient's a priori risk for aneuploidy, typically based on age. This estimate provides a pregnancy specific risk assessment. We reviewed the benefits and limitations of each option. Specifically, we discussed the conditions for which each test screens, the detection rates, and false positive rates of each. They were also counseled regarding diagnostic testing via [facility] and amniocentesis. We reviewed the approximate 1 in 300-500 risk for complications for amniocentesis, including spontaneous pregnancy loss. After consideration of all the options, they would like to return at the appropriate time for NIPS.  , those results will be available 10-14 days after being drawn.    Savannah Edwards is from [place] and Savannah Edwards is from [place].  Persons of [place] descent are at increased risk for certain hemoglobinopathies.  Specifically, they are at an  increased risk for beta thalassemia and Hemoglobin E (Hb E).  Both of these are autosomal recessive conditions and involve abnormalities in the beta globin gene on chromosome 11.  Hb E is especially significant when combined with beta thalassemia trait (Hb E/ beta-thal).  This condition is highly variable, but may result in clinical symptoms similar to beta thalassemia major.  Hb EE (Hb E inherited from each parent) is generally benign.  Persons from [place] are also at increased risk for alpha thalassemia, also an autosomal recessive condition.  Alpha thalassemia is different in its inheritance as there are two alpha globin genes on each chromosome 16 (aa/aa).  A person can be a carrier of one alpha gene mutation (aa/a-) or of more than one mutation.  A person who carriers two alpha globin gene mutations can either carry them in cis (both on the same chromosome aa/--) or in trans (on different chromosomes a-/a-) .  East Asian alpha thalassemia carriers usually have a cis arrangement (aa/--) of their alpha globin gene mutations.  Thus, carriers are at risk for having a child with the most severe form of alpha thalassemia, hydrops fetalis with Hb Barts, which is associated with an absence of alpha globin chain synthesis as a result of deletions of all four alpha globin genes (--/--).  There is also a non-deleted alpha chain hemoglobin variant, Hemoglobin Constant Spring (Hb CS) that is relatively common among [place]ians.  Hb CS can lead to Hemoglobin H disease when inherited along with alpha thalassemia trait (aa/--).  Hemoglobinopathy carrier rates in the [place] Asian populations are quite variable and difficult to quantitate specifically.  Savannah Edwards had CBC and hemoglobin electrophoresis performed, both of which  were within normal limits.  As such, her chance to carry these variants is reduced.  Both family histories were reviewed and found to be noncontributory for birth defects, intellectual disability, and  known genetic conditions. Without further information regarding the provided family history, an accurate genetic risk cannot be calculated. Further genetic counseling is warranted if more information is obtained.  Savannah Edwards denied exposure to environmental toxins or chemical agents. She denied the use of alcohol, tobacco or street drugs. She denied significant viral illnesses during the course of her pregnancy. Her medical and surgical histories were noncontributory.   I counseled this couple regarding the above risks and available options.  The approximate face-to-face time with the genetic counselor was 40 minutes.  Savannah Gemmaaragh Conrad, MS,  Certified Genetic Counselor

## 2013-12-01 ENCOUNTER — Encounter: Payer: Self-pay | Admitting: Obstetrics

## 2013-12-01 ENCOUNTER — Ambulatory Visit (INDEPENDENT_AMBULATORY_CARE_PROVIDER_SITE_OTHER): Payer: 59 | Admitting: Obstetrics

## 2013-12-01 VITALS — BP 102/71 | Temp 98.4°F | Wt 134.0 lb

## 2013-12-01 DIAGNOSIS — Z3480 Encounter for supervision of other normal pregnancy, unspecified trimester: Secondary | ICD-10-CM

## 2013-12-01 DIAGNOSIS — O0990 Supervision of high risk pregnancy, unspecified, unspecified trimester: Secondary | ICD-10-CM

## 2013-12-01 NOTE — Progress Notes (Signed)
Pulse: 79 Patient denies any concerns.

## 2013-12-10 ENCOUNTER — Ambulatory Visit (HOSPITAL_COMMUNITY)
Admission: RE | Admit: 2013-12-10 | Discharge: 2013-12-10 | Disposition: A | Discharge status: Home / Self Care | Payer: 59 | Source: Ambulatory Visit | Attending: Obstetrics | Admitting: Obstetrics

## 2013-12-10 ENCOUNTER — Other Ambulatory Visit: Payer: Self-pay

## 2013-12-10 DIAGNOSIS — O2620 Pregnancy care for patient with recurrent pregnancy loss, unspecified trimester: Secondary | ICD-10-CM | POA: Insufficient documentation

## 2013-12-10 DIAGNOSIS — O09529 Supervision of elderly multigravida, unspecified trimester: Secondary | ICD-10-CM | POA: Insufficient documentation

## 2013-12-15 ENCOUNTER — Ambulatory Visit (INDEPENDENT_AMBULATORY_CARE_PROVIDER_SITE_OTHER): Payer: 59 | Admitting: Obstetrics

## 2013-12-15 ENCOUNTER — Encounter: Payer: Self-pay | Admitting: Obstetrics

## 2013-12-15 VITALS — BP 113/79 | Temp 98.2°F | Wt 132.0 lb

## 2013-12-15 DIAGNOSIS — O2620 Pregnancy care for patient with recurrent pregnancy loss, unspecified trimester: Secondary | ICD-10-CM

## 2013-12-15 DIAGNOSIS — O09519 Supervision of elderly primigravida, unspecified trimester: Secondary | ICD-10-CM | POA: Insufficient documentation

## 2013-12-15 DIAGNOSIS — O2621 Pregnancy care for patient with recurrent pregnancy loss, first trimester: Secondary | ICD-10-CM | POA: Insufficient documentation

## 2013-12-15 DIAGNOSIS — Z3400 Encounter for supervision of normal first pregnancy, unspecified trimester: Secondary | ICD-10-CM

## 2013-12-15 LAB — POCT URINALYSIS DIPSTICK
Bilirubin, UA: NEGATIVE
Blood, UA: NEGATIVE
Glucose, UA: NEGATIVE
Ketones, UA: NEGATIVE
LEUKOCYTES UA: NEGATIVE
NITRITE UA: NEGATIVE
Protein, UA: NEGATIVE
Spec Grav, UA: 1.025
Urobilinogen, UA: NEGATIVE
pH, UA: 5

## 2013-12-15 NOTE — Progress Notes (Signed)
Pulse 91 Pt states that she has had some spotting after intercourse.

## 2013-12-21 ENCOUNTER — Telehealth (HOSPITAL_COMMUNITY): Payer: Self-pay | Admitting: Maternal and Fetal Medicine

## 2013-12-21 NOTE — Telephone Encounter (Signed)
Called Savannah Edwards to discuss her cell free fetal DNA test results.  Mrs. Savannah BabeYanling Baranek had Panorama testing through [facility].  Testing was offered because of AMA.   The patient was identified by name and DOB.  We reviewed that these are within normal limits, showing a less than 1 in 10,000 risk for trisomies 21, 18 and 13, and monosomy X (Turner syndrome).  In addition, the risk for triploidy/vanishing twin and sex chromosome trisomies (47,XXX and 47,XXY) was also low risk.   We reviewed that this testing identifies > 99% of pregnancies with trisomy 1121, trisomy 7913, sex chromosome trisomies (47,XXX and 47,XXY), and triploidy. The detection rate for trisomy 18 is 96%.  The detection rate for monosomy X is ~92%.  The false positive rate is <0.1% for all conditions. Testing was also consistent with female gender.  The patient did not wish to know gender.  She understands that this testing does not identify all genetic conditions.  All questions were answered to her satisfaction, she was encouraged to call with additional questions or concerns.  Mady Gemmaaragh Conrad, MS Certified Genetic Counselor

## 2014-01-12 ENCOUNTER — Ambulatory Visit (INDEPENDENT_AMBULATORY_CARE_PROVIDER_SITE_OTHER): Payer: 59 | Admitting: Obstetrics

## 2014-01-12 ENCOUNTER — Encounter: Payer: Self-pay | Admitting: Obstetrics

## 2014-01-12 VITALS — BP 121/80 | Temp 97.2°F | Wt 136.0 lb

## 2014-01-12 DIAGNOSIS — N390 Urinary tract infection, site not specified: Secondary | ICD-10-CM

## 2014-01-12 DIAGNOSIS — Z3400 Encounter for supervision of normal first pregnancy, unspecified trimester: Secondary | ICD-10-CM

## 2014-01-12 LAB — POCT URINALYSIS DIPSTICK
Bilirubin, UA: NEGATIVE
Ketones, UA: NEGATIVE
NITRITE UA: NEGATIVE
PH UA: 5
Spec Grav, UA: >=1.03
Urobilinogen, UA: NEGATIVE

## 2014-01-12 MED ORDER — PRENATAL MULTIVITAMIN CH: 1.0000 | ORAL_TABLET | Freq: Every day | ORAL | Status: DC

## 2014-01-12 NOTE — Progress Notes (Signed)
Pulse- 106 Patient states she is having increased lower back pain

## 2014-01-13 LAB — URINE CULTURE
Colony Count: NO GROWTH
Organism ID, Bacteria: NO GROWTH

## 2014-01-21 ENCOUNTER — Other Ambulatory Visit: Payer: Self-pay | Admitting: Obstetrics

## 2014-01-21 DIAGNOSIS — Z3689 Encounter for other specified antenatal screening: Secondary | ICD-10-CM

## 2014-01-21 DIAGNOSIS — O09529 Supervision of elderly multigravida, unspecified trimester: Secondary | ICD-10-CM

## 2014-01-28 ENCOUNTER — Ambulatory Visit (HOSPITAL_COMMUNITY)
Admission: RE | Admit: 2014-01-28 | Discharge: 2014-01-28 | Disposition: A | Discharge status: Home / Self Care | Payer: 59 | Source: Ambulatory Visit | Attending: Obstetrics | Admitting: Obstetrics

## 2014-01-28 VITALS — BP 115/73 | HR 95 | Wt 139.0 lb

## 2014-01-28 DIAGNOSIS — O2621 Pregnancy care for patient with recurrent pregnancy loss, first trimester: Secondary | ICD-10-CM

## 2014-01-28 DIAGNOSIS — O2620 Pregnancy care for patient with recurrent pregnancy loss, unspecified trimester: Secondary | ICD-10-CM | POA: Insufficient documentation

## 2014-01-28 DIAGNOSIS — Z363 Encounter for antenatal screening for malformations: Secondary | ICD-10-CM | POA: Insufficient documentation

## 2014-01-28 DIAGNOSIS — Z3689 Encounter for other specified antenatal screening: Secondary | ICD-10-CM

## 2014-01-28 DIAGNOSIS — O358XX0 Maternal care for other (suspected) fetal abnormality and damage, not applicable or unspecified: Secondary | ICD-10-CM | POA: Insufficient documentation

## 2014-01-28 DIAGNOSIS — O09529 Supervision of elderly multigravida, unspecified trimester: Secondary | ICD-10-CM

## 2014-01-28 DIAGNOSIS — Z1389 Encounter for screening for other disorder: Secondary | ICD-10-CM | POA: Insufficient documentation

## 2014-02-09 ENCOUNTER — Encounter: Payer: 59 | Admitting: Obstetrics

## 2014-02-09 ENCOUNTER — Ambulatory Visit (INDEPENDENT_AMBULATORY_CARE_PROVIDER_SITE_OTHER): Payer: 59 | Admitting: Obstetrics

## 2014-02-09 VITALS — BP 108/75 | Temp 98.2°F | Wt 138.0 lb

## 2014-02-09 DIAGNOSIS — Z3400 Encounter for supervision of normal first pregnancy, unspecified trimester: Secondary | ICD-10-CM

## 2014-02-09 DIAGNOSIS — N760 Acute vaginitis: Secondary | ICD-10-CM

## 2014-02-09 LAB — POCT URINALYSIS DIPSTICK
BILIRUBIN UA: NEGATIVE
KETONES UA: NEGATIVE
Leukocytes, UA: NEGATIVE
Nitrite, UA: NEGATIVE
PH UA: 5
RBC UA: NEGATIVE
Spec Grav, UA: 1.02
Urobilinogen, UA: NEGATIVE

## 2014-02-09 NOTE — Progress Notes (Signed)
Pulse 97  Pt states that she is still having some vaginal itching. Pt states that she feels a bump in vaginal area and would like exam.

## 2014-02-10 LAB — WET PREP BY MOLECULAR PROBE
Candida species: NEGATIVE
Gardnerella vaginalis: NEGATIVE
Trichomonas vaginosis: NEGATIVE

## 2014-02-21 ENCOUNTER — Encounter: Payer: Self-pay | Admitting: Obstetrics

## 2014-02-25 ENCOUNTER — Ambulatory Visit (HOSPITAL_COMMUNITY)
Admission: RE | Admit: 2014-02-25 | Discharge: 2014-02-25 | Disposition: A | Discharge status: Home / Self Care | Payer: 59 | Source: Ambulatory Visit | Attending: Obstetrics | Admitting: Obstetrics

## 2014-02-25 DIAGNOSIS — O09529 Supervision of elderly multigravida, unspecified trimester: Secondary | ICD-10-CM | POA: Insufficient documentation

## 2014-02-25 DIAGNOSIS — O2620 Pregnancy care for patient with recurrent pregnancy loss, unspecified trimester: Secondary | ICD-10-CM | POA: Diagnosis present

## 2014-02-25 DIAGNOSIS — O2621 Pregnancy care for patient with recurrent pregnancy loss, first trimester: Secondary | ICD-10-CM

## 2014-03-09 ENCOUNTER — Other Ambulatory Visit: Payer: 59

## 2014-03-10 ENCOUNTER — Ambulatory Visit (INDEPENDENT_AMBULATORY_CARE_PROVIDER_SITE_OTHER): Payer: 59 | Admitting: Obstetrics

## 2014-03-10 ENCOUNTER — Other Ambulatory Visit: Payer: 59

## 2014-03-10 VITALS — BP 100/68 | HR 90 | Temp 98.0°F | Wt 140.0 lb

## 2014-03-10 DIAGNOSIS — Z3400 Encounter for supervision of normal first pregnancy, unspecified trimester: Secondary | ICD-10-CM

## 2014-03-10 LAB — POCT URINALYSIS DIPSTICK
GLUCOSE UA: NEGATIVE
Ketones, UA: NEGATIVE
NITRITE UA: NEGATIVE
Protein, UA: NEGATIVE
Spec Grav, UA: 1.02
pH, UA: 5

## 2014-03-10 NOTE — Progress Notes (Signed)
Subjective:    Savannah Edwards is a 38 y.o. female being seen today for her obstetrical visit. She is at 7656w0d gestation. Patient reports: no complaints . Fetal movement: normal.  Problem List Items Addressed This Visit   None    Visit Diagnoses   Supervision of normal first pregnancy    -  Primary    Relevant Orders       POCT urinalysis dipstick (Completed)       Glucose Tolerance, 2 Hours w/1 Hour       CBC       HIV antibody       RPR      Patient Active Problem List   Diagnosis Date Noted  . 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 Supervision of high-risk pregnancy of elderly primigravida 12/15/2013  . Recurrent pregnancy loss, antepartum condition or complication 11/17/2013  . Supervision of high-risk pregnancy of elderly multigravida 11/17/2013  . Recurrent pregnancy loss, unspecified as to episode of care or not applicable 07/19/2013   Objective:    BP 100/68  Pulse 90  Temp(Src) 98 F (36.7 C)  Wt 140 lb (63.504 kg)  LMP 09/23/2013 FHT: 150 BPM  Uterine Size: size equals dates     Assessment:    Pregnancy @ 3256w0d    Plan:    OBGCT: ordered. Signs and symptoms of preterm labor: discussed.  Labs, problem list reviewed and updated 2 hr GTT being done today Follow up in 2 weeks.

## 2014-03-11 LAB — CBC
HEMATOCRIT: 32.5 % — AB (ref 36.0–46.0)
Hemoglobin: 11 g/dL — ABNORMAL LOW (ref 12.0–15.0)
MCH: 31.8 pg (ref 26.0–34.0)
MCHC: 33.8 g/dL (ref 30.0–36.0)
MCV: 93.9 fL (ref 78.0–100.0)
Platelets: 297 10*3/uL (ref 150–400)
RBC: 3.46 MIL/uL — ABNORMAL LOW (ref 3.87–5.11)
RDW: 14.2 % (ref 11.5–15.5)
WBC: 8.2 10*3/uL (ref 4.0–10.5)

## 2014-03-11 LAB — HIV ANTIBODY (ROUTINE TESTING W REFLEX): HIV 1&2 Ab, 4th Generation: NONREACTIVE

## 2014-03-11 LAB — GLUCOSE TOLERANCE, 2 HOURS W/ 1HR
GLUCOSE, 2 HOUR: 143 mg/dL — AB (ref 70–139)
GLUCOSE: 181 mg/dL — AB (ref 70–170)
Glucose, Fasting: 96 mg/dL (ref 70–99)

## 2014-03-11 LAB — RPR

## 2014-03-24 ENCOUNTER — Ambulatory Visit (INDEPENDENT_AMBULATORY_CARE_PROVIDER_SITE_OTHER): Payer: 59 | Admitting: Obstetrics

## 2014-03-24 ENCOUNTER — Encounter: Payer: Self-pay | Admitting: Obstetrics

## 2014-03-24 VITALS — BP 103/70 | HR 96 | Temp 98.6°F | Wt 143.0 lb

## 2014-03-24 DIAGNOSIS — Z3400 Encounter for supervision of normal first pregnancy, unspecified trimester: Secondary | ICD-10-CM

## 2014-03-24 LAB — POCT URINALYSIS DIPSTICK
Glucose, UA: 100
Ketones, UA: 1
NITRITE UA: NEGATIVE
PH UA: 6
Protein, UA: NEGATIVE
RBC UA: 50
Spec Grav, UA: 1.015

## 2014-03-24 NOTE — Progress Notes (Signed)
Subjective:    Savannah Edwards is a 38 y.o. female being seen today for her obstetrical visit. She is at 4262w0d gestation. Patient reports: no complaints . Fetal movement: normal.  Problem List Items Addressed This Visit   None    Visit Diagnoses   Supervision of normal first pregnancy    -  Primary    Relevant Orders       POCT urinalysis dipstick (Completed)      Patient Active Problem List   Diagnosis Date Noted  . 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 Supervision of high-risk pregnancy of elderly primigravida 12/15/2013  . Recurrent pregnancy loss, antepartum condition or complication 11/17/2013  . Supervision of high-risk pregnancy of elderly multigravida 11/17/2013  . Recurrent pregnancy loss, unspecified as to episode of care or not applicable 07/19/2013   Objective:    BP 103/70  Pulse 96  Temp(Src) 98.6 F (37 C)  Wt 143 lb (64.864 kg)  LMP 09/23/2013 FHT: 150 BPM  Uterine Size: size equals dates     Assessment:    Pregnancy @ 1962w0d    Plan:    OBGCT: discussed.  Labs, problem list reviewed and updated 2 hr GTT planned Follow up in 2 weeks.

## 2014-03-28 ENCOUNTER — Inpatient Hospital Stay (HOSPITAL_COMMUNITY): Admission: AD | Admit: 2014-03-28 | Payer: 59 | Source: Ambulatory Visit | Admitting: Obstetrics

## 2014-04-07 ENCOUNTER — Encounter: Payer: Self-pay | Admitting: Obstetrics

## 2014-04-07 ENCOUNTER — Encounter: Payer: 59 | Admitting: Obstetrics

## 2014-04-07 ENCOUNTER — Ambulatory Visit (INDEPENDENT_AMBULATORY_CARE_PROVIDER_SITE_OTHER): Payer: 59 | Admitting: Obstetrics

## 2014-04-07 VITALS — BP 103/68 | HR 88 | Temp 98.3°F | Wt 145.0 lb

## 2014-04-07 DIAGNOSIS — Z3400 Encounter for supervision of normal first pregnancy, unspecified trimester: Secondary | ICD-10-CM

## 2014-04-07 NOTE — Progress Notes (Signed)
Subjective:    Savannah Edwards is a 38 y.o. female being seen today for her obstetrical visit. She is at [redacted]w[redacted]d gestation. Patient reports no complaints. Fetal movement: normal.  Problem List Items Addressed This Visit   None    Visit Diagnoses   Supervision of normal first pregnancy    -  Primary    Relevant Orders       POCT urinalysis dipstick      Patient Active Problem List   Diagnosis Date Noted  . 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 Supervision of high-risk pregnancy of elderly primigravida 12/15/2013  . Recurrent pregnancy loss, antepartum condition or complication 11/17/2013  . Supervision of high-risk pregnancy of elderly multigravida 11/17/2013  . Recurrent pregnancy loss, unspecified as to episode of care or not applicable 07/19/2013   Objective:    BP 103/68  Pulse 88  Temp(Src) 98.3 F (36.8 C)  Wt 145 lb (65.772 kg)  LMP 09/23/2013 FHT:  150 BPM  Uterine Size: size equals dates  Presentation: unsure     Assessment:    Pregnancy @ [redacted]w[redacted]d weeks   Plan:     labs reviewed, problem list updated Consent signed. GBS sent TDAP offered  Rhogam given for RH negative Pediatrician: discussed. Infant feeding: plans to breastfeed. Maternity leave: not discussed. Cigarette smoking: never smoked. Orders Placed This Encounter  Procedures  . POCT urinalysis dipstick   No orders of the defined types were placed in this encounter.   Follow up in 2 Weeks.

## 2014-04-08 ENCOUNTER — Encounter: Payer: Self-pay | Admitting: *Deleted

## 2014-04-08 ENCOUNTER — Other Ambulatory Visit: Payer: Self-pay | Admitting: *Deleted

## 2014-04-08 DIAGNOSIS — O24419 Gestational diabetes mellitus in pregnancy, unspecified control: Secondary | ICD-10-CM

## 2014-04-08 MED ORDER — METFORMIN HCL ER (MOD) 1000 MG PO TB24: 1000.0000 mg | ORAL_TABLET | Freq: Two times a day (BID) | ORAL | Status: DC

## 2014-04-11 LAB — POCT URINALYSIS DIPSTICK
Bilirubin, UA: NEGATIVE
Blood, UA: NEGATIVE
Glucose, UA: NEGATIVE
Ketones, UA: NEGATIVE
Nitrite, UA: NEGATIVE
Protein, UA: NEGATIVE
Spec Grav, UA: 1.015
Urobilinogen, UA: NEGATIVE
pH, UA: 5

## 2014-04-14 ENCOUNTER — Ambulatory Visit: Payer: 59 | Admitting: *Deleted

## 2014-04-14 DIAGNOSIS — O24919 Unspecified diabetes mellitus in pregnancy, unspecified trimester: Secondary | ICD-10-CM

## 2014-04-21 ENCOUNTER — Encounter: Payer: Self-pay | Admitting: Obstetrics

## 2014-04-21 ENCOUNTER — Ambulatory Visit (INDEPENDENT_AMBULATORY_CARE_PROVIDER_SITE_OTHER): Payer: 59 | Admitting: Obstetrics

## 2014-04-21 VITALS — BP 111/75 | HR 88 | Temp 98.9°F

## 2014-04-21 DIAGNOSIS — O09519 Supervision of elderly primigravida, unspecified trimester: Secondary | ICD-10-CM

## 2014-04-21 DIAGNOSIS — O24919 Unspecified diabetes mellitus in pregnancy, unspecified trimester: Secondary | ICD-10-CM

## 2014-04-21 DIAGNOSIS — O09513 Supervision of elderly primigravida, third trimester: Secondary | ICD-10-CM

## 2014-04-21 DIAGNOSIS — O24913 Unspecified diabetes mellitus in pregnancy, third trimester: Secondary | ICD-10-CM | POA: Insufficient documentation

## 2014-04-21 DIAGNOSIS — E119 Type 2 diabetes mellitus without complications: Secondary | ICD-10-CM

## 2014-04-21 DIAGNOSIS — Z3400 Encounter for supervision of normal first pregnancy, unspecified trimester: Secondary | ICD-10-CM

## 2014-04-21 LAB — POCT URINALYSIS DIPSTICK
GLUCOSE UA: 50
KETONES UA: NEGATIVE
NITRITE UA: NEGATIVE
PH UA: 5
Spec Grav, UA: 1.02

## 2014-04-21 NOTE — Progress Notes (Signed)
Patient is using her chart to record her glucose levels- she is not presently taking any medication.

## 2014-04-21 NOTE — Progress Notes (Signed)
Subjective:    Savannah Edwards is a 38 y.o. female being seen today for her obstetrical visit. She is at 582w0d gestation. Patient reports no complaints. Fetal movement: normal.  Problem List Items Addressed This Visit   Diabetes mellitus complicating pregnancy in third trimester, antepartum - Primary   Relevant Orders      US OB Comp + 14 Wk      AMB Referral to Maternal Fetal Medicine (MFM)   Supervision of high-risk pregnancy of elderly primigravida   Relevant Orders      AMB Referral to Maternal Fetal Medicine (MFM)    Other Visit Diagnoses   Supervision of normal first pregnancy        Relevant Orders       POCT urinalysis dipstick (Completed)      Patient Active Problem List   Diagnosis Date Noted  . Diabetes mellitus complicating pregnancy in third trimester, antepartum 04/21/2014  . 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 Supervision of high-risk pregnancy of elderly primigravida 12/15/2013  . Recurrent pregnancy loss, antepartum condition or complication 11/17/2013  . Supervision of high-risk pregnancy of elderly multigravida 11/17/2013  . Recurrent pregnancy loss, unspecified as to episode of care or not applicable 07/19/2013   Objective:    BP 111/75  Pulse 88  Temp(Src) 98.9 F (37.2 C)  LMP 09/23/2013 FHT:  150 BPM  Uterine Size: size equals dates  Presentation: unsure     Assessment:    Pregnancy @ 232w0d weeks   Plan:     labs reviewed, problem list updated Consent signed. GBS sent TDAP offered  Rhogam given for RH negative Pediatrician: discussed. Infant feeding: plans to breastfeed. Maternity leave: not discussed. Cigarette smoking: never smoked. Orders Placed This Encounter  Procedures  . US OB Comp + 14 Wk    Standing Status: Future     Number of Occurrences:      Standing Expiration Date: 06/22/2015    Order Specific Question:  Reason for Exam (SYMPTOM  OR DIAGNOSIS REQUIRED)    Answer:  Gestational Diabetes.   Diet controlled.    Order Specific Question:  Preferred imaging location?    Answer:  [facility]  . AMB Referral to Maternal Fetal Medicine (MFM)    Referral Priority:  Routine    Referral Type:  Consultation    Number of Visits Requested:  1  . AMB Referral to Maternal Fetal Medicine (MFM)    Referral Priority:  Routine    Referral Type:  Consultation    Number of Visits Requested:  1  . POCT urinalysis dipstick   No orders of the defined types were placed in this encounter.   Follow up in 2 Weeks.

## 2014-04-29 ENCOUNTER — Other Ambulatory Visit: Payer: Self-pay | Admitting: Obstetrics

## 2014-04-29 ENCOUNTER — Encounter (HOSPITAL_COMMUNITY): Payer: 59

## 2014-04-29 ENCOUNTER — Ambulatory Visit (HOSPITAL_COMMUNITY)
Admission: RE | Admit: 2014-04-29 | Discharge: 2014-04-29 | Disposition: A | Discharge status: Home / Self Care | Payer: 59 | Source: Ambulatory Visit | Attending: Obstetrics | Admitting: Obstetrics

## 2014-04-29 DIAGNOSIS — E119 Type 2 diabetes mellitus without complications: Secondary | ICD-10-CM | POA: Insufficient documentation

## 2014-04-29 DIAGNOSIS — O24919 Unspecified diabetes mellitus in pregnancy, unspecified trimester: Secondary | ICD-10-CM | POA: Insufficient documentation

## 2014-04-29 DIAGNOSIS — O24913 Unspecified diabetes mellitus in pregnancy, third trimester: Secondary | ICD-10-CM

## 2014-04-29 DIAGNOSIS — Z3689 Encounter for other specified antenatal screening: Secondary | ICD-10-CM | POA: Insufficient documentation

## 2014-05-09 ENCOUNTER — Ambulatory Visit (INDEPENDENT_AMBULATORY_CARE_PROVIDER_SITE_OTHER): Payer: 59 | Admitting: Obstetrics

## 2014-05-09 VITALS — BP 97/71 | HR 81 | Temp 97.6°F | Wt 141.0 lb

## 2014-05-09 DIAGNOSIS — Z3400 Encounter for supervision of normal first pregnancy, unspecified trimester: Secondary | ICD-10-CM

## 2014-05-09 DIAGNOSIS — Z3403 Encounter for supervision of normal first pregnancy, third trimester: Secondary | ICD-10-CM

## 2014-05-09 LAB — POCT URINALYSIS DIPSTICK
BILIRUBIN UA: NEGATIVE
GLUCOSE UA: NEGATIVE
Ketones, UA: NEGATIVE
Leukocytes, UA: NEGATIVE
NITRITE UA: NEGATIVE
Protein, UA: NEGATIVE
RBC UA: NEGATIVE
Spec Grav, UA: 1.01
UROBILINOGEN UA: NEGATIVE
pH, UA: 6

## 2014-05-10 ENCOUNTER — Encounter: Payer: Self-pay | Admitting: Obstetrics

## 2014-05-10 NOTE — Progress Notes (Signed)
Subjective:    Lum BabeYanling Roussell is a 38 y.o. female being seen today for her obstetrical visit. She is at 7141w5d gestation. Patient reports no complaints. Fetal movement: normal.  Problem List Items Addressed This Visit   None    Visit Diagnoses   Encounter for supervision of normal first pregnancy in third trimester    -  Primary    Relevant Orders       POCT urinalysis dipstick (Completed)      Patient Active Problem List   Diagnosis Date Noted  . Diabetes mellitus complicating pregnancy in third trimester, antepartum 04/21/2014  . 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 Supervision of high-risk pregnancy of elderly primigravida 12/15/2013  . Recurrent pregnancy loss, antepartum condition or complication 11/17/2013  . Supervision of high-risk pregnancy of elderly multigravida 11/17/2013  . Recurrent pregnancy loss, unspecified as to episode of care or not applicable 07/19/2013   Objective:    BP 97/71  Pulse 81  Temp(Src) 97.6 F (36.4 C)  Wt 141 lb (63.957 kg)  LMP 09/23/2013 FHT:  150 BPM  Uterine Size: size equals dates  Presentation: unsure     Assessment:    Pregnancy @ 3641w5d weeks   Plan:     labs reviewed, problem list updated Consent signed. GBS sent TDAP offered  Rhogam given for RH negative Pediatrician: discussed. Infant feeding: plans to breastfeed. Maternity leave: discussed. Cigarette smoking: never smoked. Orders Placed This Encounter  Procedures  . POCT urinalysis dipstick   No orders of the defined types were placed in this encounter.   Follow up in 1 Week.

## 2014-05-19 ENCOUNTER — Encounter: Payer: 59 | Admitting: Obstetrics

## 2014-05-19 ENCOUNTER — Ambulatory Visit (INDEPENDENT_AMBULATORY_CARE_PROVIDER_SITE_OTHER): Payer: 59 | Admitting: Obstetrics

## 2014-05-19 VITALS — BP 113/79 | HR 91 | Temp 98.1°F | Wt 141.0 lb

## 2014-05-19 DIAGNOSIS — R824 Acetonuria: Secondary | ICD-10-CM

## 2014-05-19 DIAGNOSIS — E119 Type 2 diabetes mellitus without complications: Secondary | ICD-10-CM

## 2014-05-19 DIAGNOSIS — O24410 Gestational diabetes mellitus in pregnancy, diet controlled: Secondary | ICD-10-CM

## 2014-05-19 DIAGNOSIS — Z3400 Encounter for supervision of normal first pregnancy, unspecified trimester: Secondary | ICD-10-CM

## 2014-05-19 DIAGNOSIS — Z3403 Encounter for supervision of normal first pregnancy, third trimester: Secondary | ICD-10-CM

## 2014-05-19 DIAGNOSIS — O09519 Supervision of elderly primigravida, unspecified trimester: Secondary | ICD-10-CM

## 2014-05-19 DIAGNOSIS — O24919 Unspecified diabetes mellitus in pregnancy, unspecified trimester: Secondary | ICD-10-CM

## 2014-05-19 DIAGNOSIS — O2623 Pregnancy care for patient with recurrent pregnancy loss, third trimester: Secondary | ICD-10-CM

## 2014-05-19 DIAGNOSIS — O99810 Abnormal glucose complicating pregnancy: Secondary | ICD-10-CM

## 2014-05-19 DIAGNOSIS — O09513 Supervision of elderly primigravida, third trimester: Secondary | ICD-10-CM

## 2014-05-19 DIAGNOSIS — O24913 Unspecified diabetes mellitus in pregnancy, third trimester: Secondary | ICD-10-CM

## 2014-05-19 DIAGNOSIS — O2620 Pregnancy care for patient with recurrent pregnancy loss, unspecified trimester: Secondary | ICD-10-CM

## 2014-05-19 LAB — POCT URINALYSIS DIPSTICK
Blood, UA: NEGATIVE
GLUCOSE UA: NEGATIVE
Ketones, UA: 2
Nitrite, UA: NEGATIVE
PROTEIN UA: NEGATIVE
Spec Grav, UA: 1.01
pH, UA: 5

## 2014-05-20 ENCOUNTER — Encounter: Payer: Self-pay | Admitting: Obstetrics

## 2014-05-20 DIAGNOSIS — O24410 Gestational diabetes mellitus in pregnancy, diet controlled: Secondary | ICD-10-CM | POA: Insufficient documentation

## 2014-05-20 NOTE — Progress Notes (Signed)
Subjective:    Lum BabeYanling Morini is a 38 y.o. female being seen today for her obstetrical visit. She is at 2215w1d gestation. Patient reports no complaints. Fetal movement: normal.  Problem List Items Addressed This Visit   None    Visit Diagnoses   Encounter for supervision of normal first pregnancy in third trimester    -  Primary    Relevant Orders       POCT urinalysis dipstick (Completed)    Ketonuria        Relevant Orders       Urine culture    Diet controlled gestational diabetes mellitus in third trimester        Relevant Orders       Fetal non-stress test      Patient Active Problem List   Diagnosis Date Noted  . Diabetes mellitus complicating pregnancy in third trimester, antepartum 04/21/2014  . 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 Supervision of high-risk pregnancy of elderly primigravida 12/15/2013  . Recurrent pregnancy loss, antepartum condition or complication 11/17/2013  . Supervision of high-risk pregnancy of elderly multigravida 11/17/2013  . Recurrent pregnancy loss, unspecified as to episode of care or not applicable 07/19/2013   Objective:    BP 113/79  Pulse 91  Temp(Src) 98.1 F (36.7 C)  Wt 141 lb (63.957 kg)  LMP 09/23/2013 FHT:  150 BPM  Uterine Size: size equals dates  Presentation: unsure     Assessment:    Pregnancy @ 415w1d weeks   Gestational Diabetes well controlled  NST Reactive  Plan:     labs reviewed, problem list updated Consent signed. GBS sent TDAP offered  Rhogam given for RH negative Pediatrician: discussed. Infant feeding: plans to breastfeed. Maternity leave: discussed. Cigarette smoking: never smoked. Orders Placed This Encounter  Procedures  . Urine culture  . Fetal non-stress test    Standing Status: Standing     Number of Occurrences: 1     Standing Expiration Date:   . POCT urinalysis dipstick   No orders of the defined types were placed in this encounter.   Follow up in 1  Week.

## 2014-05-21 LAB — URINE CULTURE
COLONY COUNT: NO GROWTH
ORGANISM ID, BACTERIA: NO GROWTH

## 2014-05-26 ENCOUNTER — Ambulatory Visit (INDEPENDENT_AMBULATORY_CARE_PROVIDER_SITE_OTHER): Payer: 59 | Admitting: Obstetrics

## 2014-05-26 ENCOUNTER — Encounter: Payer: 59 | Admitting: Obstetrics

## 2014-05-26 ENCOUNTER — Encounter: Payer: Self-pay | Admitting: Obstetrics

## 2014-05-26 VITALS — BP 107/75 | HR 83 | Temp 97.2°F | Wt 139.0 lb

## 2014-05-26 DIAGNOSIS — O99810 Abnormal glucose complicating pregnancy: Secondary | ICD-10-CM

## 2014-05-26 DIAGNOSIS — Z3403 Encounter for supervision of normal first pregnancy, third trimester: Secondary | ICD-10-CM

## 2014-05-26 DIAGNOSIS — Z3400 Encounter for supervision of normal first pregnancy, unspecified trimester: Secondary | ICD-10-CM

## 2014-05-26 DIAGNOSIS — O24919 Unspecified diabetes mellitus in pregnancy, unspecified trimester: Secondary | ICD-10-CM

## 2014-05-26 NOTE — Addendum Note (Signed)
Addended by: Odessa FlemingBOHNE, Squire Withey M on: 05/26/2014 05:53 PM   Modules accepted: Orders

## 2014-05-26 NOTE — Progress Notes (Signed)
Subjective:    Savannah Edwards is a 38 y.o. female being seen today for her obstetrical visit. She is at 4761w0d gestation. Patient reports no complaints. Fetal movement: normal.  Problem List Items Addressed This Visit   None     Patient Active Problem List   Diagnosis Date Noted  . Diet controlled gestational diabetes mellitus in third trimester 05/20/2014  . Diabetes mellitus complicating pregnancy in third trimester, antepartum 04/21/2014  . 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 Supervision of high-risk pregnancy of elderly primigravida 12/15/2013  . Recurrent pregnancy loss, antepartum condition or complication 11/17/2013  . Supervision of high-risk pregnancy of elderly multigravida 11/17/2013  . Recurrent pregnancy loss, unspecified as to episode of care or not applicable 07/19/2013   Objective:    BP 107/75  Pulse 83  Temp(Src) 97.2 F (36.2 C)  Wt 139 lb (63.05 kg)  LMP 09/23/2013 FHT:  150 BPM  Uterine Size: size equals dates  Presentation: unsure     Assessment:    Pregnancy @ 6561w0d weeks   Plan:     labs reviewed, problem list updated Consent signed. GBS sent TDAP offered  Rhogam given for RH negative Pediatrician: discussed. Infant feeding: plans to breastfeed. Maternity leave: discussed. Cigarette smoking: never smoked. No orders of the defined types were placed in this encounter.   No orders of the defined types were placed in this encounter.   Follow up in 1 Week.

## 2014-05-28 LAB — STREP B DNA PROBE: GBSP: DETECTED

## 2014-06-02 ENCOUNTER — Ambulatory Visit (INDEPENDENT_AMBULATORY_CARE_PROVIDER_SITE_OTHER): Payer: 59 | Admitting: Obstetrics

## 2014-06-02 VITALS — Temp 97.9°F | Wt 141.0 lb

## 2014-06-02 DIAGNOSIS — Z3403 Encounter for supervision of normal first pregnancy, third trimester: Secondary | ICD-10-CM

## 2014-06-02 DIAGNOSIS — Z3400 Encounter for supervision of normal first pregnancy, unspecified trimester: Secondary | ICD-10-CM

## 2014-06-03 ENCOUNTER — Encounter: Payer: Self-pay | Admitting: Obstetrics

## 2014-06-03 LAB — POCT URINALYSIS DIPSTICK
Bilirubin, UA: NEGATIVE
Blood, UA: NEGATIVE
Glucose, UA: NEGATIVE
Ketones, UA: NEGATIVE
Nitrite, UA: NEGATIVE
PH UA: 5
PROTEIN UA: NEGATIVE
SPEC GRAV UA: 1.01
UROBILINOGEN UA: NEGATIVE

## 2014-06-03 NOTE — Progress Notes (Signed)
Subjective:    Savannah Edwards is a 38 y.o. female being seen today for her obstetrical visit. She is at 4956w1d gestation. Patient reports no complaints. Fetal movement: normal.  Problem List Items Addressed This Visit   None    Visit Diagnoses   Encounter for supervision of normal first pregnancy in third trimester    -  Primary    Relevant Orders       POCT urinalysis dipstick      Patient Active Problem List   Diagnosis Date Noted  . Diet controlled gestational diabetes mellitus in third trimester 05/20/2014  . Diabetes mellitus complicating pregnancy in third trimester, antepartum 04/21/2014  . 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 Supervision of high-risk pregnancy of elderly primigravida 12/15/2013  . Recurrent pregnancy loss, antepartum condition or complication 11/17/2013  . Supervision of high-risk pregnancy of elderly multigravida 11/17/2013  . Recurrent pregnancy loss, unspecified as to episode of care or not applicable 07/19/2013   Objective:    Temp(Src) 97.9 F (36.6 C)  Wt 141 lb (63.957 kg)  LMP 09/23/2013 FHT:  150 BPM  Uterine Size: size equals dates  Presentation: unsure     Assessment:    Pregnancy @ 6956w1d weeks   Plan:     labs reviewed, problem list updated Consent signed. GBS sent TDAP offered  Rhogam given for RH negative Pediatrician: discussed. Infant feeding: plans to breastfeed. Maternity leave: discussed. Cigarette smoking: never smoked. Orders Placed This Encounter  Procedures  . POCT urinalysis dipstick   No orders of the defined types were placed in this encounter.   Follow up in 1 Week.

## 2014-06-09 ENCOUNTER — Ambulatory Visit (INDEPENDENT_AMBULATORY_CARE_PROVIDER_SITE_OTHER): Payer: 59 | Admitting: Obstetrics

## 2014-06-09 ENCOUNTER — Encounter: Payer: Self-pay | Admitting: Obstetrics

## 2014-06-09 VITALS — BP 106/81 | HR 94 | Temp 97.4°F | Wt 142.0 lb

## 2014-06-09 DIAGNOSIS — Z3400 Encounter for supervision of normal first pregnancy, unspecified trimester: Secondary | ICD-10-CM

## 2014-06-09 DIAGNOSIS — Z3403 Encounter for supervision of normal first pregnancy, third trimester: Secondary | ICD-10-CM

## 2014-06-09 LAB — POCT URINALYSIS DIPSTICK
Glucose, UA: NEGATIVE
KETONES UA: NEGATIVE
Nitrite, UA: NEGATIVE
PH UA: 6
Protein, UA: NEGATIVE
RBC UA: NEGATIVE
Spec Grav, UA: 1.015

## 2014-06-09 NOTE — Progress Notes (Signed)
  Subjective:    Savannah Edwards is a 38 y.o. female being seen today for her obstetrical visit. She is at 1923w0d gestation. Patient reports no complaints. Fetal movement: normal.  Problem List Items Addressed This Visit   None    Visit Diagnoses   Encounter for supervision of normal first pregnancy in third trimester    -  Primary    Relevant Orders       POCT urinalysis dipstick      Patient Active Problem List   Diagnosis Date Noted  . Diet controlled gestational diabetes mellitus in third trimester 05/20/2014  . Diabetes mellitus complicating pregnancy in third trimester, antepartum 04/21/2014  . 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 Supervision of high-risk pregnancy of elderly primigravida 12/15/2013  . Recurrent pregnancy loss, antepartum condition or complication 11/17/2013  . Supervision of high-risk pregnancy of elderly multigravida 11/17/2013  . Recurrent pregnancy loss, unspecified as to episode of care or not applicable 07/19/2013    Objective:    BP 106/81  Pulse 94  Temp(Src) 97.4 F (36.3 C)  Wt 142 lb (64.411 kg)  LMP 09/23/2013 FHT: 150 BPM  Uterine Size: size equals dates  Presentations: unsure  Pelvic Exam: Deferred    Assessment:    Pregnancy @ 6823w0d weeks   Plan:   Plans for delivery: Vaginal anticipated; labs reviewed; problem list updated Counseling: Consent signed. Infant feeding: plans to breastfeed. Cigarette smoking: never smoked. L&D discussion: symptoms of labor, discussed when to call, discussed what number to call, anesthetic/analgesic options reviewed and delivering clinician:  plans Physician. Postpartum supports and preparation: circumcision discussed and contraception plans discussed.  Follow up in 1 Week.

## 2014-06-16 ENCOUNTER — Ambulatory Visit (INDEPENDENT_AMBULATORY_CARE_PROVIDER_SITE_OTHER): Payer: 59 | Admitting: Obstetrics

## 2014-06-16 VITALS — BP 110/77 | HR 92 | Temp 97.9°F | Wt 138.0 lb

## 2014-06-16 DIAGNOSIS — Z3403 Encounter for supervision of normal first pregnancy, third trimester: Secondary | ICD-10-CM

## 2014-06-16 DIAGNOSIS — Z3400 Encounter for supervision of normal first pregnancy, unspecified trimester: Secondary | ICD-10-CM

## 2014-06-17 ENCOUNTER — Encounter: Payer: Self-pay | Admitting: Obstetrics

## 2014-06-17 NOTE — Progress Notes (Signed)
Subjective:    Savannah Edwards is a 38 y.o. female being seen today for her obstetrical visit. She is at 8762w1d gestation. Patient reports no complaints. Fetal movement: normal.  Problem List Items Addressed This Visit   None    Visit Diagnoses   Encounter for supervision of normal first pregnancy in third trimester    -  Primary    Relevant Orders       POCT urinalysis dipstick      Patient Active Problem List   Diagnosis Date Noted  . Diet controlled gestational diabetes mellitus in third trimester 05/20/2014  . Diabetes mellitus complicating pregnancy in third trimester, antepartum 04/21/2014  . 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 Supervision of high-risk pregnancy of elderly primigravida 12/15/2013  . Recurrent pregnancy loss, antepartum condition or complication 11/17/2013  . Supervision of high-risk pregnancy of elderly multigravida 11/17/2013  . Recurrent pregnancy loss, unspecified as to episode of care or not applicable 07/19/2013    Objective:    BP 110/77  Pulse 92  Temp(Src) 97.9 F (36.6 C)  Wt 138 lb (62.596 kg)  LMP 09/23/2013 FHT: 150 BPM  Uterine Size: size equals dates  Presentations: unsure  Pelvic Exam: Deferred    Assessment:    Pregnancy @ 6562w1d weeks   Plan:   Plans for delivery: Vaginal anticipated; labs reviewed; problem list updated Counseling: Consent signed. Infant feeding: plans to breastfeed. Cigarette smoking: never smoked. L&D discussion: symptoms of labor, discussed when to call, discussed what number to call, anesthetic/analgesic options reviewed and delivering clinician:  plans Physician. Postpartum supports and preparation: circumcision discussed and contraception plans discussed.  Follow up in 1 Week.

## 2014-06-20 LAB — POCT URINALYSIS DIPSTICK
GLUCOSE UA: NEGATIVE
KETONES UA: 2
Leukocytes, UA: NEGATIVE
NITRITE UA: NEGATIVE
RBC UA: NEGATIVE
SPEC GRAV UA: 1.015
pH, UA: 5

## 2014-06-22 ENCOUNTER — Encounter: Payer: Self-pay | Admitting: Obstetrics

## 2014-06-22 ENCOUNTER — Ambulatory Visit (INDEPENDENT_AMBULATORY_CARE_PROVIDER_SITE_OTHER): Payer: 59 | Admitting: Obstetrics

## 2014-06-22 VITALS — BP 120/82 | Temp 98.2°F | Wt 139.0 lb

## 2014-06-22 DIAGNOSIS — O09519 Supervision of elderly primigravida, unspecified trimester: Secondary | ICD-10-CM

## 2014-06-22 DIAGNOSIS — O24419 Gestational diabetes mellitus in pregnancy, unspecified control: Secondary | ICD-10-CM

## 2014-06-22 DIAGNOSIS — Z3400 Encounter for supervision of normal first pregnancy, unspecified trimester: Secondary | ICD-10-CM

## 2014-06-22 DIAGNOSIS — O09513 Supervision of elderly primigravida, third trimester: Secondary | ICD-10-CM

## 2014-06-22 DIAGNOSIS — Z3403 Encounter for supervision of normal first pregnancy, third trimester: Secondary | ICD-10-CM

## 2014-06-22 DIAGNOSIS — O99810 Abnormal glucose complicating pregnancy: Secondary | ICD-10-CM

## 2014-06-22 LAB — POCT URINALYSIS DIPSTICK
BILIRUBIN UA: NEGATIVE
Blood, UA: NEGATIVE
Glucose, UA: NEGATIVE
Ketones, UA: NEGATIVE
Leukocytes, UA: NEGATIVE
NITRITE UA: NEGATIVE
Protein, UA: NEGATIVE
Spec Grav, UA: 1.015
UROBILINOGEN UA: NEGATIVE
pH, UA: 6

## 2014-06-22 NOTE — Progress Notes (Signed)
Subjective:    Savannah Edwards is a 38 y.o. female being seen today for her obstetrical visit. She is at 5533w6d gestation. Patient reports no complaints. Fetal movement: normal.  Problem List Items Addressed This Visit   None    Visit Diagnoses   Gestational diabetes mellitus in third trimester, unspecified diabetic control    -  Primary    Relevant Orders       Fetal nonstress test    Encounter for supervision of normal first pregnancy in third trimester        Relevant Orders       POCT urinalysis dipstick      Patient Active Problem List   Diagnosis Date Noted  . Diet controlled gestational diabetes mellitus in third trimester 05/20/2014  . Diabetes mellitus complicating pregnancy in third trimester, antepartum 04/21/2014  . 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 Supervision of high-risk pregnancy of elderly primigravida 12/15/2013  . Recurrent pregnancy loss, antepartum condition or complication 11/17/2013  . Supervision of high-risk pregnancy of elderly multigravida 11/17/2013  . Recurrent pregnancy loss, unspecified as to episode of care or not applicable 07/19/2013    Objective:    BP 120/82  Temp(Src) 98.2 F (36.8 C)  Wt 139 lb (63.05 kg)  LMP 09/23/2013 FHT: 150 BPM  Uterine Size: size equals dates  Presentations: cephalic  Pelvic Exam:              Dilation: Closed       Effacement: 50%             Station:  -3    Consistency: medium            Position: posterior     Assessment:    Pregnancy @ 5533w6d weeks   Plan:   Plans for delivery: Vaginal anticipated; labs reviewed; problem list updated Counseling: Consent signed. Infant feeding: plans to breastfeed. Cigarette smoking: never smoked. L&D discussion: symptoms of labor, discussed when to call, discussed what number to call, anesthetic/analgesic options reviewed and delivering clinician:  plans Physician. Postpartum supports and preparation: circumcision discussed and  contraception plans discussed.  Follow up in 1 Week.

## 2014-06-29 ENCOUNTER — Ambulatory Visit (INDEPENDENT_AMBULATORY_CARE_PROVIDER_SITE_OTHER): Payer: 59 | Admitting: Obstetrics

## 2014-06-29 ENCOUNTER — Encounter: Payer: Self-pay | Admitting: Obstetrics

## 2014-06-29 VITALS — BP 110/80 | Temp 98.1°F | Wt 139.0 lb

## 2014-06-29 DIAGNOSIS — O09513 Supervision of elderly primigravida, third trimester: Secondary | ICD-10-CM

## 2014-06-29 DIAGNOSIS — O99810 Abnormal glucose complicating pregnancy: Secondary | ICD-10-CM

## 2014-06-29 DIAGNOSIS — O09519 Supervision of elderly primigravida, unspecified trimester: Secondary | ICD-10-CM

## 2014-06-29 DIAGNOSIS — O24419 Gestational diabetes mellitus in pregnancy, unspecified control: Secondary | ICD-10-CM | POA: Insufficient documentation

## 2014-06-29 LAB — POCT URINALYSIS DIPSTICK
BILIRUBIN UA: NEGATIVE
GLUCOSE UA: NEGATIVE
Ketones, UA: NEGATIVE
Leukocytes, UA: NEGATIVE
NITRITE UA: NEGATIVE
RBC UA: NEGATIVE
Spec Grav, UA: 1.015
Urobilinogen, UA: NEGATIVE
pH, UA: 6

## 2014-06-29 NOTE — Progress Notes (Signed)
Subjective:    Savannah Edwards is a 38 y.o. female being seen today for her obstetrical visit. She is at [redacted]w[redacted]d gestation. Patient reports no complaints. Fetal movement: normal.  Problem List Items Addressed This Visit   None     Patient Active Problem List   Diagnosis Date Noted  . Diet controlled gestational diabetes mellitus in third trimester 05/20/2014  . Diabetes mellitus complicating pregnancy in third trimester, antepartum 04/21/2014  . 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 Supervision of high-risk pregnancy of elderly primigravida 12/15/2013  . Recurrent pregnancy loss, antepartum condition or complication 11/17/2013  . Supervision of high-risk pregnancy of elderly multigravida 11/17/2013  . Recurrent pregnancy loss, unspecified as to episode of care or not applicable 07/19/2013    Objective:    BP 110/80  Temp(Src) 98.1 F (36.7 C)  Wt 139 lb (63.05 kg)  LMP 09/23/2013 FHT: 150 BPM  Uterine Size: size equals dates  Presentations: cephalic  Pelvic Exam: Deferred    Assessment:    Pregnancy @ [redacted]w[redacted]d weeks   NST Reactive Plan:   Plans for delivery: Vaginal anticipated; labs reviewed; problem list updated Counseling: Consent signed. Infant feeding: plans to breastfeed. Cigarette smoking: never smoked. L&D discussion: symptoms of labor, discussed when to call, discussed what number to call, anesthetic/analgesic options reviewed and delivering clinician:  plans Physician. Postpartum supports and preparation: circumcision discussed and contraception plans discussed.  Follow up in 1 Week.

## 2014-06-29 NOTE — Addendum Note (Signed)
Addended by: Odessa Fleming on: 06/29/2014 04:04 PM   Modules accepted: Orders

## 2014-06-29 NOTE — Progress Notes (Signed)
Patients urine also showed a small amount of leukocytes, that was not entered in urinalysis for today.

## 2014-07-02 ENCOUNTER — Encounter (HOSPITAL_COMMUNITY): Payer: Self-pay | Admitting: *Deleted

## 2014-07-02 ENCOUNTER — Inpatient Hospital Stay (HOSPITAL_COMMUNITY)
Admission: AD | Admit: 2014-07-02 | Discharge: 2014-07-02 | Disposition: A | Discharge status: Home / Self Care | Payer: 59 | Source: Ambulatory Visit | Attending: Obstetrics | Admitting: Obstetrics

## 2014-07-02 DIAGNOSIS — O368131 Decreased fetal movements, third trimester, fetus 1: Secondary | ICD-10-CM

## 2014-07-02 DIAGNOSIS — Z3689 Encounter for other specified antenatal screening: Secondary | ICD-10-CM

## 2014-07-02 DIAGNOSIS — O99810 Abnormal glucose complicating pregnancy: Secondary | ICD-10-CM | POA: Diagnosis not present

## 2014-07-02 DIAGNOSIS — O09529 Supervision of elderly multigravida, unspecified trimester: Secondary | ICD-10-CM | POA: Insufficient documentation

## 2014-07-02 DIAGNOSIS — O368190 Decreased fetal movements, unspecified trimester, not applicable or unspecified: Secondary | ICD-10-CM | POA: Insufficient documentation

## 2014-07-02 HISTORY — DX: Gestational diabetes mellitus in pregnancy, unspecified control: O24.419

## 2014-07-02 NOTE — MAU Note (Signed)
Pt reports baby not moving as much today and is having some mild cramping

## 2014-07-02 NOTE — MAU Provider Note (Signed)
History     CSN: [id number]  Arrival date and time: 07/02/14 2032   First Provider Initiated Contact with Patient 07/02/14 2114      Chief Complaint  Patient presents with  . Decreased Fetal Movement   HPI 38 y.o. G4P0030 at [redacted]w[redacted]d w/ decreased fetal movement today, denies contractions, LOF, bleeding. Pregnancy complicated by diet controlled GDM, otherwise normal prenatal course.   Past Medical History  Diagnosis Date  . No pertinent past medical history   . Gestational diabetes     Past Surgical History  Procedure Laterality Date  . Dilation and curettage of uterus      Family History  Problem Relation Age of Onset  . Alcohol abuse Neg Hx   . Arthritis Neg Hx   . Asthma Neg Hx   . Birth defects Neg Hx   . COPD Neg Hx   . Depression Neg Hx   . Diabetes Neg Hx   . Drug abuse Neg Hx   . Early death Neg Hx   . Hearing loss Neg Hx   . Heart disease Neg Hx   . Hyperlipidemia Neg Hx   . Kidney disease Neg Hx   . Learning disabilities Neg Hx   . Mental illness Neg Hx   . Mental retardation Neg Hx   . Miscarriages / Stillbirths Neg Hx   . Stroke Neg Hx   . Vision loss Neg Hx   . Other Neg Hx   . Hypertension Mother   . Cancer Paternal Uncle     History  Substance Use Topics  . Smoking status: Never Smoker   . Smokeless tobacco: Never Used  . Alcohol Use: No    Allergies: No Known Allergies  Prescriptions prior to admission  Medication Sig Dispense Refill  . Cholecalciferol (VITAMIN D-3) 1000 UNITS CAPS Take by mouth.      . folic acid (FOLVITE) 400 MCG tablet Take 400 mcg by mouth daily.      . Prenatal Vit-Fe Fumarate-FA (PRENATAL MULTIVITAMIN) TABS tablet Take 1 tablet by mouth daily.  90 tablet  3    Review of Systems  Constitutional: Negative.   Respiratory: Negative.   Cardiovascular: Negative.   Gastrointestinal: Negative for nausea, vomiting, abdominal pain, diarrhea and constipation.  Genitourinary: Negative for dysuria, urgency, frequency,  hematuria and flank pain.       Negative for vaginal bleeding, cramping/contractions  Musculoskeletal: Negative.   Neurological: Negative.   Psychiatric/Behavioral: Negative.    Physical Exam   Blood pressure 119/78, pulse 88, temperature 98.1 F (36.7 C), temperature source Oral, resp. rate 18, height 5' (1.524 m), weight 140 lb 6.4 oz (63.685 kg), last menstrual period 09/23/2013.  Physical Exam  Nursing note and vitals reviewed. Constitutional: She is oriented to person, place, and time. She appears well-developed and well-nourished. No distress.  Cardiovascular: Normal rate.   Respiratory: Effort normal.  GI: Soft. There is no tenderness.  Musculoskeletal: Normal range of motion.  Neurological: She is alert and oriented to person, place, and time.  Skin: Skin is warm and dry.  Psychiatric: She has a normal mood and affect.    MAU Course  Procedures FHR: 130, mod variability, + accels, no decels, audible fetal movement TOCO: UC q 5 min, not felt by patient  Assessment and Plan   1. NST (non-stress test) reactive   2. Decreased fetal movement, third trimester, fetus 1   Reactive tracing, good fetal movement during MAU evaluation Rev'd labor and fetal movement precautions, RTC PRN  or as scheduled    Medication List         folic acid 400 MCG tablet  Commonly known as:  FOLVITE  Take 400 mcg by mouth daily.     prenatal multivitamin Tabs tablet  Take 1 tablet by mouth daily.     Vitamin D-3 1000 UNITS Caps  Take by mouth.            Follow-up Information   Follow up with HARPER,CHARLES A, MD. (as scheduled or return to MAU sooner as needed)    Specialty:  Obstetrics and Gynecology   Contact information:   [address] [place] [address] [phone]         [facility] 07/02/2014, 9:17 PM

## 2014-07-06 ENCOUNTER — Ambulatory Visit (INDEPENDENT_AMBULATORY_CARE_PROVIDER_SITE_OTHER): Payer: 59 | Admitting: Obstetrics

## 2014-07-06 ENCOUNTER — Encounter (HOSPITAL_COMMUNITY): Payer: Self-pay | Admitting: *Deleted

## 2014-07-06 ENCOUNTER — Telehealth (HOSPITAL_COMMUNITY): Payer: Self-pay | Admitting: *Deleted

## 2014-07-06 VITALS — BP 114/80 | HR 84 | Temp 98.3°F | Wt 137.0 lb

## 2014-07-06 DIAGNOSIS — O480 Post-term pregnancy: Secondary | ICD-10-CM

## 2014-07-06 DIAGNOSIS — Z3403 Encounter for supervision of normal first pregnancy, third trimester: Secondary | ICD-10-CM

## 2014-07-06 DIAGNOSIS — Z3400 Encounter for supervision of normal first pregnancy, unspecified trimester: Secondary | ICD-10-CM

## 2014-07-06 LAB — POCT URINALYSIS DIPSTICK
BILIRUBIN UA: NEGATIVE
Glucose, UA: NEGATIVE
NITRITE UA: NEGATIVE
RBC UA: NEGATIVE
Spec Grav, UA: 1.015
Urobilinogen, UA: NEGATIVE
pH, UA: 5.5

## 2014-07-06 NOTE — Telephone Encounter (Signed)
Preadmission screen  

## 2014-07-07 ENCOUNTER — Encounter (HOSPITAL_COMMUNITY): Payer: Self-pay

## 2014-07-07 ENCOUNTER — Encounter: Payer: Self-pay | Admitting: Obstetrics

## 2014-07-07 ENCOUNTER — Inpatient Hospital Stay (HOSPITAL_COMMUNITY)
Admission: RE | Admit: 2014-07-07 | Discharge: 2014-07-11 | DRG: 775 | Disposition: A | Discharge status: Home / Self Care | Payer: 59 | Source: Ambulatory Visit | Attending: Obstetrics | Admitting: Obstetrics

## 2014-07-07 DIAGNOSIS — D649 Anemia, unspecified: Secondary | ICD-10-CM | POA: Diagnosis present

## 2014-07-07 DIAGNOSIS — O9902 Anemia complicating childbirth: Secondary | ICD-10-CM | POA: Diagnosis present

## 2014-07-07 DIAGNOSIS — O09529 Supervision of elderly multigravida, unspecified trimester: Secondary | ICD-10-CM | POA: Diagnosis present

## 2014-07-07 DIAGNOSIS — O480 Post-term pregnancy: Secondary | ICD-10-CM | POA: Diagnosis present

## 2014-07-07 DIAGNOSIS — O99814 Abnormal glucose complicating childbirth: Secondary | ICD-10-CM | POA: Diagnosis present

## 2014-07-07 DIAGNOSIS — Z8249 Family history of ischemic heart disease and other diseases of the circulatory system: Secondary | ICD-10-CM | POA: Diagnosis not present

## 2014-07-07 LAB — CBC
HCT: 37.4 % (ref 36.0–46.0)
Hemoglobin: 13.5 g/dL (ref 12.0–15.0)
MCH: 35.2 pg — ABNORMAL HIGH (ref 26.0–34.0)
MCHC: 36.1 g/dL — ABNORMAL HIGH (ref 30.0–36.0)
MCV: 97.4 fL (ref 78.0–100.0)
PLATELETS: 265 10*3/uL (ref 150–400)
RBC: 3.84 MIL/uL — ABNORMAL LOW (ref 3.87–5.11)
RDW: 13.2 % (ref 11.5–15.5)
WBC: 5.8 10*3/uL (ref 4.0–10.5)

## 2014-07-07 LAB — TYPE AND SCREEN
ABO/RH(D): B POS
ANTIBODY SCREEN: NEGATIVE

## 2014-07-07 LAB — GLUCOSE, CAPILLARY: Glucose-Capillary: 97 mg/dL (ref 70–99)

## 2014-07-07 MED ORDER — OXYCODONE-ACETAMINOPHEN 5-325 MG PO TABS: 2.0000 | ORAL_TABLET | ORAL | Status: DC | PRN

## 2014-07-07 MED ORDER — ONDANSETRON HCL 4 MG/2ML IJ SOLN: 4.0000 mg | Freq: Four times a day (QID) | INTRAMUSCULAR | Status: DC | PRN

## 2014-07-07 MED ORDER — LACTATED RINGERS IV SOLN
INTRAVENOUS | Status: DC
  Administered 2014-07-07 – 2014-07-08 (×5): via INTRAVENOUS

## 2014-07-07 MED ORDER — OXYTOCIN 40 UNITS IN LACTATED RINGERS INFUSION - SIMPLE MED: 62.5000 mL/h | INTRAVENOUS | Status: DC

## 2014-07-07 MED ORDER — FLEET ENEMA 7-19 GM/118ML RE ENEM: 1.0000 | ENEMA | RECTAL | Status: DC | PRN

## 2014-07-07 MED ORDER — LACTATED RINGERS IV SOLN
500.0000 mL | INTRAVENOUS | Status: DC | PRN
  Administered 2014-07-08 – 2014-07-09 (×3): 500 mL via INTRAVENOUS

## 2014-07-07 MED ORDER — BUTORPHANOL TARTRATE 1 MG/ML IJ SOLN
1.0000 mg | INTRAMUSCULAR | Status: DC | PRN
  Administered 2014-07-08 (×2): 1 mg via INTRAVENOUS
  Filled 2014-07-07 (×2): qty 1

## 2014-07-07 MED ORDER — OXYTOCIN BOLUS FROM INFUSION: 500.0000 mL | INTRAVENOUS | Status: DC

## 2014-07-07 MED ORDER — OXYCODONE-ACETAMINOPHEN 5-325 MG PO TABS: 1.0000 | ORAL_TABLET | ORAL | Status: DC | PRN

## 2014-07-07 MED ORDER — CITRIC ACID-SODIUM CITRATE 334-500 MG/5ML PO SOLN: 30.0000 mL | ORAL | Status: DC | PRN

## 2014-07-07 MED ORDER — ACETAMINOPHEN 325 MG PO TABS: 650.0000 mg | ORAL_TABLET | ORAL | Status: DC | PRN

## 2014-07-07 MED ORDER — OXYTOCIN 40 UNITS IN LACTATED RINGERS INFUSION - SIMPLE MED
1.0000 m[IU]/min | INTRAVENOUS | Status: DC
  Administered 2014-07-07: 2 m[IU]/min via INTRAVENOUS
  Filled 2014-07-07: qty 1000

## 2014-07-07 MED ORDER — LIDOCAINE HCL (PF) 1 % IJ SOLN
30.0000 mL | INTRAMUSCULAR | Status: DC | PRN
  Administered 2014-07-09: 30 mL via SUBCUTANEOUS
  Filled 2014-07-07 (×2): qty 30

## 2014-07-07 MED ORDER — TERBUTALINE SULFATE 1 MG/ML IJ SOLN: 0.2500 mg | Freq: Once | INTRAMUSCULAR | Status: AC | PRN

## 2014-07-07 MED ORDER — PENICILLIN G POTASSIUM 5000000 UNITS IJ SOLR
2.5000 10*6.[IU] | INTRAVENOUS | Status: DC
  Administered 2014-07-08 (×6): 2.5 10*6.[IU] via INTRAVENOUS
  Filled 2014-07-07 (×10): qty 2.5

## 2014-07-07 MED ORDER — DEXTROSE 5 % IV SOLN
5.0000 10*6.[IU] | Freq: Once | INTRAVENOUS | Status: AC
  Administered 2014-07-07: 5 10*6.[IU] via INTRAVENOUS
  Filled 2014-07-07: qty 5

## 2014-07-07 NOTE — Progress Notes (Signed)
Subjective:    Savannah Edwards is a 38 y.o. female being seen today for her obstetrical visit. She is at [redacted]w[redacted]d gestation. Patient reports occasional contractions. Fetal movement: normal.  Problem List Items Addressed This Visit   None    Visit Diagnoses   Encounter for supervision of normal first pregnancy in third trimester    -  Primary    Relevant Orders       POCT urinalysis dipstick (Completed)      Patient Active Problem List   Diagnosis Date Noted  . GDM (gestational diabetes mellitus) 06/29/2014  . Diet controlled gestational diabetes mellitus in third trimester 05/20/2014  . Diabetes mellitus complicating pregnancy in third trimester, antepartum 04/21/2014  . 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 Supervision of high-risk pregnancy of elderly primigravida 12/15/2013  . Recurrent pregnancy loss, antepartum condition or complication 11/17/2013  . Supervision of high-risk pregnancy of elderly multigravida 11/17/2013  . Recurrent pregnancy loss, unspecified as to episode of care or not applicable 07/19/2013    Objective:    BP 114/80  Pulse 84  Temp(Src) 98.3 F (36.8 C)  Wt 137 lb (62.143 kg)  LMP 09/23/2013 FHT:  150 BPM  Uterine Size: size equals dates  Presentation: cephalic  Pelvic Exam:              Dilation: Closed       Effacement: 50%   Station:  -3     Consistency: medium            Position: posterior     Assessment:    Pregnancy @ [redacted]w[redacted]d  weeks   Plan:    Postdates management: discussed fetal surveillance and induction, discussed fetal movement, NST reactive, biophysical profile ordered. Induction: scheduled for 07-07-14, written information given.

## 2014-07-08 ENCOUNTER — Inpatient Hospital Stay (HOSPITAL_COMMUNITY): Payer: 59 | Admitting: Anesthesiology

## 2014-07-08 ENCOUNTER — Encounter (HOSPITAL_COMMUNITY): Payer: 59 | Admitting: Anesthesiology

## 2014-07-08 LAB — RPR

## 2014-07-08 MED ORDER — FENTANYL 2.5 MCG/ML BUPIVACAINE 1/10 % EPIDURAL INFUSION (WH - ANES)
INTRAMUSCULAR | Status: AC
  Filled 2014-07-08: qty 125

## 2014-07-08 MED ORDER — LACTATED RINGERS IV SOLN: 500.0000 mL | Freq: Once | INTRAVENOUS | Status: DC

## 2014-07-08 MED ORDER — PHENYLEPHRINE 40 MCG/ML (10ML) SYRINGE FOR IV PUSH (FOR BLOOD PRESSURE SUPPORT)
PREFILLED_SYRINGE | INTRAVENOUS | Status: AC
  Filled 2014-07-08: qty 10

## 2014-07-08 MED ORDER — PHENYLEPHRINE 40 MCG/ML (10ML) SYRINGE FOR IV PUSH (FOR BLOOD PRESSURE SUPPORT)
80.0000 ug | PREFILLED_SYRINGE | INTRAVENOUS | Status: DC | PRN
  Filled 2014-07-08: qty 2
  Filled 2014-07-08: qty 10

## 2014-07-08 MED ORDER — PHENYLEPHRINE 40 MCG/ML (10ML) SYRINGE FOR IV PUSH (FOR BLOOD PRESSURE SUPPORT)
80.0000 ug | PREFILLED_SYRINGE | INTRAVENOUS | Status: DC | PRN
  Administered 2014-07-09: 80 ug via INTRAVENOUS
  Filled 2014-07-08: qty 2

## 2014-07-08 MED ORDER — LIDOCAINE HCL (PF) 1 % IJ SOLN
INTRAMUSCULAR | Status: DC | PRN
  Administered 2014-07-08 (×4): 4 mL

## 2014-07-08 MED ORDER — LACTATED RINGERS IV SOLN
INTRAVENOUS | Status: DC
  Administered 2014-07-08: 11:00:00 via INTRAUTERINE

## 2014-07-08 MED ORDER — EPHEDRINE 5 MG/ML INJ
10.0000 mg | INTRAVENOUS | Status: DC | PRN
  Filled 2014-07-08: qty 2

## 2014-07-08 MED ORDER — DIPHENHYDRAMINE HCL 50 MG/ML IJ SOLN: 12.5000 mg | INTRAMUSCULAR | Status: DC | PRN

## 2014-07-08 MED ORDER — FENTANYL 2.5 MCG/ML BUPIVACAINE 1/10 % EPIDURAL INFUSION (WH - ANES)
14.0000 mL/h | INTRAMUSCULAR | Status: DC | PRN
  Administered 2014-07-08 (×3): 14 mL/h via EPIDURAL
  Filled 2014-07-08 (×2): qty 125

## 2014-07-08 NOTE — Progress Notes (Signed)
Savannah Edwards is a 38 y.o. G4P0030 at [redacted]w[redacted]d by LMP admitted for induction of labor due to Post dates. Due date 06-30-14. .  Subjective:   Objective: BP 130/84  Pulse 75  Temp(Src) 97.6 F (36.4 C) (Axillary)  Resp 18  Ht  (1.549 m)  Wt 137 lb (62.143 kg)  BMI 25.90 kg/m2  LMP 09/23/2013   Total I/O In: -  Out: 900 [Urine:900]  FHT:  FHR: 120 bpm, variability: moderate,  accelerations:  Present,  decelerations:  Present variable UC:   regular, every 2-4 minutes SVE:   Dilation: Lip/rim Effacement (%): 100 Station: +1;+2 Exam by:: D. brown RNC  Labs: Lab Results  Component Value Date   WBC 5.8 07/07/2014   HGB 13.5 07/07/2014   HCT 37.4 07/07/2014   MCV 97.4 07/07/2014   PLT 265 07/07/2014    Assessment / Plan: Induction of labor due to postterm,  progressing well on pitocin  Labor: Progressing normally Preeclampsia:  n/a Fetal Wellbeing:  Category I Pain Control:  Epidural I/D:  n/a Anticipated MOD:  NSVD  HARPER,CHARLES A 07/08/2014, 3:20 PM

## 2014-07-08 NOTE — Progress Notes (Signed)
Savannah Edwards is a 38 y.o. G4P0030 at [redacted]w[redacted]d by LMP admitted for posdates  Subjective:   Objective: BP 145/91  Pulse 79  Temp(Src) 98.3 F (36.8 C) (Oral)  Resp 18  Ht  (1.549 m)  Wt 137 lb (62.143 kg)  BMI 25.90 kg/m2  LMP 09/23/2013 I/O last 3 completed shifts: In: -  Out: 1350 [Urine:1350]    FHT:  FHR: 120 bpm, variability: moderate,  accelerations:  Present,  decelerations:  Present variable UC:   regular, every 2-4 minutes SVE:   Dilation: 10 Effacement (%): 100 Station: +1 Exam by:: [email or web address], rnc   Labs: Lab Results  Component Value Date   WBC 5.8 07/07/2014   HGB 13.5 07/07/2014   HCT 37.4 07/07/2014   MCV 97.4 07/07/2014   PLT 265 07/07/2014    Assessment / Plan: Protracted active phase  Labor: protracted Preeclampsia:  none Fetal Wellbeing:  Category I Pain Control:  Epidural I/D:  n/a Anticipated MOD:  NSVD  HARPER,CHARLES A 07/08/2014, 8:32 PM

## 2014-07-08 NOTE — H&P (Signed)
Savannah Edwards is a 38 y.o. female presenting for IOL. Maternal Medical History:  Fetal activity: Perceived fetal activity is normal.   Last perceived fetal movement was within the past hour.    Prenatal complications: no prenatal complications   OB History   Grav Para Term Preterm Abortions TAB SAB Ect Mult Living   Past Medical History  Diagnosis Date  . No pertinent past medical history   . Gestational diabetes     diet controlled   Past Surgical History  Procedure Laterality Date  . Dilation and curettage of uterus     Family History: family history includes Cancer in her paternal uncle; Hypertension in her mother. There is no history of Alcohol abuse, Arthritis, Asthma, Birth defects, COPD, Depression, Diabetes, Drug abuse, Early death, Hearing loss, Heart disease, Hyperlipidemia, Kidney disease, Learning disabilities, Mental illness, Mental retardation, Miscarriages / Stillbirths, Stroke, Vision loss, or Other. Social History:  reports that she has never smoked. She has never used smokeless tobacco. She reports that she does not drink alcohol or use illicit drugs.   Prenatal Transfer Tool  Maternal Diabetes: No Genetic Screening: Normal Maternal Ultrasounds/Referrals: Normal Fetal Ultrasounds or other Referrals:  None Maternal Substance Abuse:  No Significant Maternal Medications:  None Significant Maternal Lab Results:  Lab values include: Group B Strep negative Other Comments:  None  Review of Systems  All other systems reviewed and are negative.   Dilation: Lip/rim Effacement (%): 100 Station: +1;+2 Exam by:: D. brown RNC Blood pressure 130/84, pulse 75, temperature 97.6 F (36.4 C), temperature source Axillary, resp. rate 18, height  (1.549 m), weight 137 lb (62.143 kg), last menstrual period 09/23/2013. Maternal Exam:  Uterine Assessment: Contraction strength is moderate.  Abdomen: Patient reports no abdominal tenderness. Fetal  presentation: vertex  Introitus: Normal vulva. Normal vagina.  Pelvis: adequate for delivery.   Cervix: Cervix evaluated by digital exam.     Physical Exam  Nursing note and vitals reviewed. Constitutional: She is oriented to person, place, and time. She appears well-developed and well-nourished.  HENT:  Head: Normocephalic and atraumatic.  Eyes: Conjunctivae are normal. Pupils are equal, round, and reactive to light.  Neck: Normal range of motion. Neck supple.  Cardiovascular: Normal rate and regular rhythm.   Respiratory: Effort normal.  GI: Soft.  Genitourinary: Vagina normal and uterus normal.  Musculoskeletal: Normal range of motion.  Neurological: She is alert and oriented to person, place, and time.  Skin: Skin is warm and dry.  Psychiatric: She has a normal mood and affect. Her behavior is normal. Judgment and thought content normal.    Prenatal labs: ABO, Rh: --/--/B POS (09/03 2159) Antibody: NEG (09/03 2159) Rubella: 5.36 (01/14 1331) RPR: NON REAC (09/03 1945)  HBsAg: NEGATIVE (01/14 1331)  HIV: NONREACTIVE (05/07 1155)  GBS: Detected (07/23 1800)   Assessment/Plan: Post dates.  2 stage IOL.   Ariyan Brisendine A 07/08/2014, 3:24 PM

## 2014-07-08 NOTE — Anesthesia Preprocedure Evaluation (Addendum)
Anesthesia Evaluation  Patient identified by MRN, date of birth, ID band Patient awake    Reviewed: Allergy & Precautions, H&P , NPO status , Patient's Chart, lab work & pertinent test results, reviewed documented beta blocker date and time   History of Anesthesia Complications Negative for: history of anesthetic complications  Airway Mallampati: III TM Distance: >3 FB Neck ROM: full    Dental  (+) Teeth Intact   Pulmonary neg pulmonary ROS,  breath sounds clear to auscultation        Cardiovascular negative cardio ROS  Rhythm:regular Rate:Normal     Neuro/Psych negative neurological ROS  negative psych ROS   GI/Hepatic negative GI ROS, Neg liver ROS,   Endo/Other  diabetes, Gestational  Renal/GU negative Renal ROS     Musculoskeletal   Abdominal   Peds  Hematology negative hematology ROS (+)   Anesthesia Other Findings   Reproductive/Obstetrics (+) Pregnancy                          Anesthesia Physical Anesthesia Plan  ASA: II  Anesthesia Plan: Epidural   Post-op Pain Management:    Induction:   Airway Management Planned:   Additional Equipment:   Intra-op Plan:   Post-operative Plan:   Informed Consent: I have reviewed the patients History and Physical, chart, labs and discussed the procedure including the risks, benefits and alternatives for the proposed anesthesia with the patient or authorized representative who has indicated his/her understanding and acceptance.     Plan Discussed with:   Anesthesia Plan Comments:         Anesthesia Quick Evaluation

## 2014-07-08 NOTE — Anesthesia Procedure Notes (Signed)
Epidural Patient location during procedure: OB Start time: 07/08/2014 8:05 AM  Staffing Anesthesiologist: Daven Pinckney Performed by: anesthesiologist   Preanesthetic Checklist Completed: patient identified, site marked, surgical consent, pre-op evaluation, timeout performed, IV checked, risks and benefits discussed and monitors and equipment checked  Epidural Patient position: sitting Prep: site prepped and draped and DuraPrep Patient monitoring: continuous pulse ox and blood pressure Approach: midline Location: L3-L4 Injection technique: LOR air  Needle:  Needle type: Tuohy  Needle gauge: 17 G Needle length: 9 cm and 9 Needle insertion depth: 4 cm Catheter type: closed end flexible Catheter size: 19 Gauge Catheter at skin depth: 9 cm Test dose: negative  Assessment Events: blood not aspirated, injection not painful, no injection resistance, negative IV test and no paresthesia  Additional Notes Discussed risk of headache, infection, bleeding, nerve injury and failed or incomplete block.  Patient voices understanding and wishes to proceed.  Epidural placed easily on first attempt.  No paresthesia.  Patient tolerated procedure well with no apparent complications.  Savannah Edwards, MDReason for block:procedure for pain

## 2014-07-09 ENCOUNTER — Encounter (HOSPITAL_COMMUNITY): Payer: Self-pay

## 2014-07-09 LAB — CBC
HEMATOCRIT: 24.6 % — AB (ref 36.0–46.0)
HEMOGLOBIN: 8.6 g/dL — AB (ref 12.0–15.0)
MCH: 33.6 pg (ref 26.0–34.0)
MCHC: 35 g/dL (ref 30.0–36.0)
MCV: 96.1 fL (ref 78.0–100.0)
Platelets: 170 10*3/uL (ref 150–400)
RBC: 2.56 MIL/uL — ABNORMAL LOW (ref 3.87–5.11)
RDW: 13.2 % (ref 11.5–15.5)
WBC: 12.3 10*3/uL — ABNORMAL HIGH (ref 4.0–10.5)

## 2014-07-09 MED ORDER — OXYTOCIN 40 UNITS IN LACTATED RINGERS INFUSION - SIMPLE MED: 62.5000 mL/h | INTRAVENOUS | Status: DC | PRN

## 2014-07-09 MED ORDER — WITCH HAZEL-GLYCERIN EX PADS
1.0000 | MEDICATED_PAD | CUTANEOUS | Status: DC | PRN
Start: 2014-07-09 — End: 2014-07-11

## 2014-07-09 MED ORDER — OXYCODONE-ACETAMINOPHEN 5-325 MG PO TABS: 2.0000 | ORAL_TABLET | ORAL | Status: DC | PRN

## 2014-07-09 MED ORDER — BENZOCAINE-MENTHOL 20-0.5 % EX AERO: 1.0000 "application " | INHALATION_SPRAY | CUTANEOUS | Status: DC | PRN

## 2014-07-09 MED ORDER — ONDANSETRON HCL 4 MG PO TABS: 4.0000 mg | ORAL_TABLET | ORAL | Status: DC | PRN

## 2014-07-09 MED ORDER — ONDANSETRON HCL 4 MG/2ML IJ SOLN
4.0000 mg | INTRAMUSCULAR | Status: DC | PRN
Start: 2014-07-09 — End: 2014-07-11

## 2014-07-09 MED ORDER — DIBUCAINE 1 % RE OINT
1.0000 | TOPICAL_OINTMENT | RECTAL | Status: DC | PRN
Start: 2014-07-09 — End: 2014-07-11

## 2014-07-09 MED ORDER — LANOLIN HYDROUS EX OINT
TOPICAL_OINTMENT | CUTANEOUS | Status: DC | PRN
Start: 2014-07-09 — End: 2014-07-11

## 2014-07-09 MED ORDER — OXYCODONE-ACETAMINOPHEN 5-325 MG PO TABS: 1.0000 | ORAL_TABLET | ORAL | Status: DC | PRN

## 2014-07-09 MED ORDER — PRENATAL MULTIVITAMIN CH
1.0000 | ORAL_TABLET | Freq: Every day | ORAL | Status: DC
  Administered 2014-07-09 – 2014-07-10 (×2): 1 via ORAL
  Filled 2014-07-09 (×2): qty 1

## 2014-07-09 MED ORDER — SENNOSIDES-DOCUSATE SODIUM 8.6-50 MG PO TABS
2.0000 | ORAL_TABLET | ORAL | Status: DC
  Filled 2014-07-09 (×2): qty 2

## 2014-07-09 MED ORDER — SIMETHICONE 80 MG PO CHEW
80.0000 mg | CHEWABLE_TABLET | ORAL | Status: DC | PRN
Start: 2014-07-09 — End: 2014-07-11

## 2014-07-09 MED ORDER — ZOLPIDEM TARTRATE 5 MG PO TABS: 5.0000 mg | ORAL_TABLET | Freq: Every evening | ORAL | Status: DC | PRN

## 2014-07-09 MED ORDER — IBUPROFEN 600 MG PO TABS
600.0000 mg | ORAL_TABLET | Freq: Four times a day (QID) | ORAL | Status: DC
  Administered 2014-07-09 – 2014-07-10 (×5): 600 mg via ORAL
  Filled 2014-07-09 (×9): qty 1

## 2014-07-09 MED ORDER — DIPHENHYDRAMINE HCL 25 MG PO CAPS: 25.0000 mg | ORAL_CAPSULE | Freq: Four times a day (QID) | ORAL | Status: DC | PRN

## 2014-07-09 MED ORDER — TETANUS-DIPHTH-ACELL PERTUSSIS 5-2.5-18.5 LF-MCG/0.5 IM SUSP
0.5000 mL | Freq: Once | INTRAMUSCULAR | Status: AC
Start: 2014-07-10 — End: 2014-07-10
  Administered 2014-07-10: 0.5 mL via INTRAMUSCULAR

## 2014-07-09 NOTE — Anesthesia Postprocedure Evaluation (Signed)
  Anesthesia Post-op Note  Patient: Savannah Edwards  Procedure(s) Performed: * No procedures listed *  Patient Location: Mother/Baby  Anesthesia Type:Epidural  Level of Consciousness: awake, alert  and oriented  Airway and Oxygen Therapy: Patient Spontanous Breathing  Post-op Pain: mild  Post-op Assessment: Post-op Vital signs reviewed  Post-op Vital Signs: Reviewed and stable  Last Vitals:  Filed Vitals:   07/09/14 0415  BP: 96/59  Pulse: 100  Temp: 37.1 C  Resp: 17    Complications: No apparent anesthesia complications

## 2014-07-09 NOTE — Plan of Care (Signed)
Problem: Phase II Progression Outcomes Goal: Progress activity as tolerated unless otherwise ordered Outcome: Progressing Has been up x 1 without feeling faint.  States feels much better

## 2014-07-09 NOTE — Progress Notes (Signed)
Pt sitting on side if bed, c/o of dizziness, helped back into supine position. BP at above.  IV bolus started

## 2014-07-09 NOTE — Lactation Note (Signed)
This note was copied from the chart of Savannah Kamsiyochukwu Buist. Lactation Consultation Note  Patient Name: Savannah Edwards ZOXWR'U Date: 07/09/2014 Reason for consult: Other (Comment) (formula exclusion)   Maternal Data Formula Feeding for Exclusion: Yes Reason for exclusion: Mother's choice to formula feed on admision  Feeding    LATCH Score/Interventions                      Lactation Tools Discussed/Used     Consult Status      Alfred Levins 07/09/2014, 8:16 AM

## 2014-07-09 NOTE — Progress Notes (Signed)
Pt states she feels better.

## 2014-07-10 NOTE — Lactation Note (Signed)
This note was copied from the chart of Savannah Britainy Kozub. Lactation Consultation Note     Initial consulst with this mom and baby, now 76 hours old, and term, weighing almost 6 pounds. I was asked by Dr Clearance Coots to try and encourage mom to breast feed. Mom was crying, not related to breast feeding, but due the baby just having a heel bllod stick done. I assured mom the baby was fine, and placed her skin to skin with mom, and this seemed to calm mom. I told her Dr Clearance Coots wanted me to speak to her about the benefits of breast feeding. Mom said she has to return to work in a month, and did not want to breast feed in that time. She also did not want to pump. With mom's permission, I examined her breasts. Mom said they are sore. On exam, they are appear normal, not engorged, and I was nto able to express any milk. Mom and baby are now 31 hours post partum. I told mom the baby will be fine with formula, that it was her choice. She asked if breast milk was really so much better for the baby, and i told her yes, but it was her decision.  Patient Name: Savannah Edwards ZOXWR'U Date: 07/10/2014 Reason for consult: Initial assessment   Maternal Data Formula Feeding for Exclusion: Yes Reason for exclusion: Mother's choice to formula feed on admision  Feeding Feeding Type: Bottle Fed - Formula Nipple Type: Slow - flow  LATCH Score/Interventions                      Lactation Tools Discussed/Used     Consult Status Consult Status: Complete    Alfred Levins 07/10/2014, 8:31 AM

## 2014-07-10 NOTE — Progress Notes (Signed)
Post Partum Day 1 Subjective: no complaints  Objective: Blood pressure 90/60, pulse 91, temperature 98.1 F (36.7 C), temperature source Oral, resp. rate 20, height  (1.549 m), weight 137 lb (62.143 kg), last menstrual period 09/23/2013, unknown if currently breastfeeding.  Physical Exam:  General: alert and no distress Lochia: appropriate Uterine Fundus: firm Incision: healing well DVT Evaluation: No evidence of DVT seen on physical exam.   Recent Labs  07/07/14 1945 07/09/14 0628  HGB 13.5 8.6*  HCT 37.4 24.6*    Assessment/Plan: Plan for discharge tomorrow Anemia.  Clinically stable.  Iron started.   LOS: 3 days   HARPER,CHARLES A 07/10/2014, 6:54 AM

## 2014-07-11 ENCOUNTER — Other Ambulatory Visit (HOSPITAL_COMMUNITY): Payer: Self-pay | Admitting: Obstetrics

## 2014-07-11 MED ORDER — OXYCODONE-ACETAMINOPHEN 5-325 MG PO TABS: 1.0000 | ORAL_TABLET | ORAL | Status: DC | PRN

## 2014-07-11 MED ORDER — IBUPROFEN 600 MG PO TABS: 600.0000 mg | ORAL_TABLET | Freq: Four times a day (QID) | ORAL | Status: DC

## 2014-07-11 MED ORDER — FUSION PLUS PO CAPS: 1.0000 | ORAL_CAPSULE | Freq: Every day | ORAL | Status: DC

## 2014-07-11 NOTE — Discharge Instructions (Signed)
Before Baby Comes Home °Ask any questions about feeding, diapering, and baby care before you leave the hospital. Ask again if you do not understand. Ask when you need to see the doctor again. °There are several things you must have before your baby comes home. °· Infant car seat. °· Crib. °¨ Do not let your baby sleep in a bed with you or anyone else. °¨ If you do not have a bed for your baby, ask the doctor what you can use that will be safe for the baby to sleep in. °Infant feeding supplies: °· 6 to 8 bottles (8 ounce size). °· 6 to 8 nipples. °· Measuring cup. °· Measuring tablespoon. °· Bottle brush. °· Sterilizer (or use any large pan or kettle with a lid). °· Formula that contains iron. °· A way to boil and cool water. °Breastfeeding supplies: °· Breast pump. °· Nipple cream. °Clothing: °· 24 to 36 cloth diapers and waterproof diaper covers or a box of disposable diapers. You may need as many as 10 to 12 diapers per day. °· 3 onesies (other clothing will depend on the time of year and the weather). °· 3 receiving blankets. °· 3 baby pajamas or gowns. °· 3 bibs. °Bath equipment: °· Mild soap. °· Petroleum jelly. No baby oil or powder. °· Soft cloth towel and washcloth. °· Cotton balls. °· Separate bath basin for baby. Only sponge bathe until umbilical cord and circumcision are healed. °Other supplies: °· Thermometer and bulb syringe (ask the hospital to send them home with you). Ask your doctor about how you should take your baby's temperature. °· One to two pacifiers. °Prepare for an emergency: °· Know how to get to the hospital and know where to admit your baby. °· Put all doctor numbers near your house phone and in your cell phone if you have one. °Prepare your family: °· Talk with siblings about the baby coming home and how they feel about it. °· Decide how you want to handle visitors and other family members. °· Take offers for help with the baby. You will need time to adjust. °Know when to call the  doctor.  °GET HELP RIGHT AWAY IF: °· Your baby's temperature is greater than 100.4°F (38°C). °· The soft spot on your baby's head starts to bulge. °· Your baby is crying with no tears or has no wet diapers for 6 hours. °· Your baby has rapid breathing. °· Your baby is not as alert. °Document Released: 10/03/2008 Document Revised: 03/07/2014 Document Reviewed: 01/10/2011 °[employer]® Patient Information ©2015 [employer]. This information is not intended to replace advice given to you by your health care provider. Make sure you discuss any questions you have with your health care provider. ° °

## 2014-07-11 NOTE — Discharge Summary (Signed)
Obstetric Discharge Summary Reason for Admission: Induction of Labor Prenatal Procedures: ultrasound Intrapartum Procedures: cesarean: low cervical, transverse Postpartum Procedures: none Complications-Operative and Postpartum: none Hemoglobin  Date Value Ref Range Status  07/09/2014 8.6* 12.0 - 15.0 g/dL Final     REPEATED TO VERIFY     DELTA CHECK NOTED     HCT  Date Value Ref Range Status  07/09/2014 24.6* 36.0 - 46.0 % Final    Physical Exam:  General: alert and no distress Lochia: appropriate Uterine Fundus: firm Incision: none DVT Evaluation: No evidence of DVT seen on physical exam.  Discharge Diagnoses: Term Pregnancy-delivered  Discharge Information: Date: 07/11/2014 Activity: pelvic rest Diet: routine Medications: PNV, Ibuprofen, Colace, Iron and Percocet Condition: stable Instructions: refer to practice specific booklet Discharge to: home Follow-up Information   Follow up with HARPER,CHARLES A, MD. Schedule an appointment as soon as possible for a visit in 2 weeks.   Specialty:  Obstetrics and Gynecology   Contact information:   [address] [place] [address] [phone]       Newborn Data: Live born female  Birth Weight: 6 lb 3.1 oz (2809 g) APGAR: 9, 9  Home with mother.  HARPER,CHARLES A 07/11/2014, 5:37 AM

## 2014-07-11 NOTE — Progress Notes (Signed)
Post Partum Day 2 Subjective: no complaints  Objective: Blood pressure 94/58, pulse 89, temperature 98.4 F (36.9 C), temperature source Oral, resp. rate 18, height  (1.549 m), weight 137 lb (62.143 kg), last menstrual period 09/23/2013, unknown if currently breastfeeding.  Physical Exam:  General: alert and no distress Lochia: appropriate Uterine Fundus: firm Incision: none DVT Evaluation: No evidence of DVT seen on physical exam.   Recent Labs  07/09/14 0628  HGB 8.6*  HCT 24.6*    Assessment/Plan: Discharge home.  Anemia.  Clinically stable.  Iron Rx.   LOS: 4 days   HARPER,CHARLES A 07/11/2014, 5:27 AM

## 2014-07-25 ENCOUNTER — Encounter: Payer: Self-pay | Admitting: Obstetrics

## 2014-07-25 ENCOUNTER — Ambulatory Visit (INDEPENDENT_AMBULATORY_CARE_PROVIDER_SITE_OTHER): Payer: 59 | Admitting: Obstetrics

## 2014-07-25 DIAGNOSIS — O872 Hemorrhoids in the puerperium: Secondary | ICD-10-CM

## 2014-07-25 DIAGNOSIS — O878 Other venous complications in the puerperium: Secondary | ICD-10-CM

## 2014-07-25 DIAGNOSIS — F329 Major depressive disorder, single episode, unspecified: Secondary | ICD-10-CM | POA: Insufficient documentation

## 2014-07-25 DIAGNOSIS — F3289 Other specified depressive episodes: Secondary | ICD-10-CM

## 2014-07-25 MED ORDER — CITALOPRAM HYDROBROMIDE 20 MG PO TABS: 20.0000 mg | ORAL_TABLET | Freq: Every day | ORAL | Status: DC

## 2014-07-25 NOTE — Progress Notes (Signed)
Subjective:     Savannah Edwards is a 38 y.o. female who presents for a postpartum visit. She is 2 weeks postpartum following a vacuum extraction vaginal delivery. I have fully reviewed the prenatal and intrapartum course. The delivery was at 41 gestational weeks. Outcome: vacuum, outlet. Anesthesia: epidural. Postpartum course has been normal. Baby's course has been normal. Baby is feeding by bottle Rush Barer. Bleeding thin lochia. Bowel function is normal. Bladder function is normal. Patient is not sexually active. Contraception method is abstinence. Postpartum depression screening: positive.  Tobacco, alcohol and substance abuse history reviewed.  Adult immunizations reviewed including TDAP, rubella and varicella.  The following portions of the patient's history were reviewed and updated as appropriate: allergies, current medications, past family history, past medical history, past social history, past surgical history and problem list.  Review of Systems A comprehensive review of systems was negative except for: Constitutional: positive for depression  GI: Hemorrhoids.  Objective:    BP 105/77  Pulse 80  Temp(Src) 97.6 F (36.4 C)  Wt 121 lb (54.885 kg)  Breastfeeding? No   PE:  Deferred   100% of 10 min visit spent on counseling and coordination of care.  Assessment:    Postpartum, 2 weeks.  Depression  Hemorrhoids, symptomatic.  Plan:    1. Contraception: abstinence 2. Celexa Rx for Postpartum Depression 3. Follow up in: 4 weeks or as needed.   2hr GTT for h/o GDM/screening for DM q 3 yrs per ADA recommendations  Healthy lifestyle practices reviewed

## 2014-08-22 ENCOUNTER — Ambulatory Visit (INDEPENDENT_AMBULATORY_CARE_PROVIDER_SITE_OTHER): Payer: 59 | Admitting: Obstetrics

## 2014-08-23 ENCOUNTER — Encounter: Payer: Self-pay | Admitting: Obstetrics

## 2014-08-23 NOTE — Progress Notes (Signed)
Subjective:     Savannah Edwards is a 38 y.o. female who presents for a postpartum visit. She is 6 weeks postpartum following a spontaneous vaginal delivery. I have fully reviewed the prenatal and intrapartum course. The delivery was at 39 gestational weeks. Outcome: spontaneous vaginal delivery. Anesthesia: epidural. Postpartum course has been normal. Baby's course has been normal. Baby is feeding by bottle Rush Barer- Gerber. Bleeding no bleeding. Bowel function is normal. Bladder function is normal. Patient is not sexually active. Contraception method is abstinence. Postpartum depression screening: negative.  Tobacco, alcohol and substance abuse history reviewed.  Adult immunizations reviewed including TDAP, rubella and varicella.  The following portions of the patient's history were reviewed and updated as appropriate: allergies, current medications, past family history, past medical history, past social history, past surgical history and problem list.  Review of Systems A comprehensive review of systems was negative.   Objective:    BP 124/87  Pulse 69  Temp(Src) 98 F (36.7 C)  Wt 123 lb (55.792 kg)  LMP 08/14/2014  Breastfeeding? No  General:  alert and no distress   Breasts:  inspection negative, no nipple discharge or bleeding, no masses or nodularity palpable  Lungs: clear to auscultation bilaterally  Heart:  regular rate and rhythm, S1, S2 normal, no murmur, click, rub or gallop  Abdomen: normal findings: soft, non-tender   Vulva:  normal  Vagina: normal vagina  Cervix:  no cervical motion tenderness  Corpus: normal size, contour, position, consistency, mobility, non-tender  Adnexa:  no mass, fullness, tenderness  Rectal Exam: Not performed.          Assessment:     Normal postpartum exam. Pap smear not done at today's visit.  Plan:    1. Contraception: none 2. Continue PNV's 3. Follow up in: 6 weeks or as needed.   Healthy lifestyle practices reviewed

## 2014-09-05 ENCOUNTER — Encounter: Payer: Self-pay | Admitting: Obstetrics

## 2014-10-31 ENCOUNTER — Encounter: Payer: Self-pay | Admitting: *Deleted

## 2014-11-22 ENCOUNTER — Ambulatory Visit: Payer: Self-pay | Admitting: Obstetrics

## 2015-02-09 ENCOUNTER — Ambulatory Visit (INDEPENDENT_AMBULATORY_CARE_PROVIDER_SITE_OTHER): Payer: 59 | Admitting: Obstetrics

## 2015-02-09 ENCOUNTER — Encounter: Payer: Self-pay | Admitting: Obstetrics

## 2015-02-09 VITALS — BP 107/81 | HR 74 | Temp 97.8°F | Ht 61.0 in | Wt 122.0 lb

## 2015-02-09 DIAGNOSIS — Z01419 Encounter for gynecological examination (general) (routine) without abnormal findings: Secondary | ICD-10-CM | POA: Diagnosis not present

## 2015-02-09 DIAGNOSIS — Z124 Encounter for screening for malignant neoplasm of cervix: Secondary | ICD-10-CM

## 2015-02-09 DIAGNOSIS — Z3169 Encounter for other general counseling and advice on procreation: Secondary | ICD-10-CM

## 2015-02-09 MED ORDER — PRENATAL PLUS 27-1 MG PO TABS: 1.0000 | ORAL_TABLET | Freq: Every day | ORAL | Status: DC

## 2015-02-09 NOTE — Addendum Note (Signed)
Addended by: Marya LandryFOSTER, Josslynn Mentzer D on: 02/09/2015 03:58 PM   Modules accepted: Orders

## 2015-02-09 NOTE — Progress Notes (Signed)
Subjective:        Savannah Edwards is a 39 y.o. female here for a routine exam.  Current complaints: None.    Personal health questionnaire:  Is patient [name], have a family history of breast and/or ovarian cancer: no Is there a family history of uterine cancer diagnosed at age < 6350, gastrointestinal cancer, urinary tract cancer, family member who is a Personnel officerLynch syndrome-associated carrier: no Is the patient overweight and hypertensive, family history of diabetes, personal history of gestational diabetes, preeclampsia or PCOS: no Is patient over 755, have PCOS,  family history of premature CHD under age 37, diabetes, smoke, have hypertension or peripheral artery disease:  no At any time, has a partner hit, kicked or otherwise hurt or frightened you?: no Over the past 2 weeks, have you felt down, depressed or hopeless?: no Over the past 2 weeks, have you felt little interest or pleasure in doing things?:no   Gynecologic History Patient's last menstrual period was 01/30/2015. Contraception: none Last Pap: unknown. Results were: normal Last mammogram: n/a. Results were: n/a  Obstetric History OB History  Gravida Para Term Preterm AB SAB TAB Ectopic Multiple Living  4 1 1  3 3    1     # Outcome Date GA Lbr Len/2nd Weight Sex Delivery Anes PTL Lv  4 Term 07/09/14 1255w2d 16:06 / 04:44 6 lb 3.1 oz (2.809 kg) F Vag-Vacuum EPI  Y  3 SAB 11/05/11 4428w0d         2 SAB 11/04/10 6749w0d            Comments: System Generated. Please review and update pregnancy details.  1 SAB 11/04/02 528w0d             Past Medical History  Diagnosis Date  . No pertinent past medical history   . Gestational diabetes     diet controlled    Past Surgical History  Procedure Laterality Date  . Dilation and curettage of uterus       Current outpatient prescriptions:  .  Cholecalciferol (VITAMIN D-3) 1000 UNITS CAPS, Take 1 capsule by mouth daily. , Disp: , Rfl:  .  citalopram (CELEXA) 20 MG tablet,  Take 1 tablet (20 mg total) by mouth daily before breakfast. (Patient not taking: Reported on 02/09/2015), Disp: 30 tablet, Rfl: 5 .  folic acid (FOLVITE) 400 MCG tablet, Take 400 mcg by mouth daily., Disp: , Rfl:  .  ibuprofen (ADVIL,MOTRIN) 600 MG tablet, TAKE 1 TABLET BY MOUTH EVERY 6 HOURS (Patient not taking: Reported on 02/09/2015), Disp: 385 tablet, Rfl: 5 .  Iron-FA-B Cmp-C-Biot-Probiotic (FUSION PLUS) CAPS, Take 1 capsule by mouth daily before breakfast. (Patient not taking: Reported on 02/09/2015), Disp: 30 capsule, Rfl: 5 .  oxyCODONE-acetaminophen (PERCOCET/ROXICET) 5-325 MG per tablet, Take 1 tablet by mouth every 4 (four) hours as needed (for pain scale less than 7). (Patient not taking: Reported on 02/09/2015), Disp: 40 tablet, Rfl: 0 .  Prenatal Vit-Fe Fumarate-FA (PRENATAL MULTIVITAMIN) TABS tablet, Take 1 tablet by mouth daily. (Patient not taking: Reported on 02/09/2015), Disp: 90 tablet, Rfl: 3 .  prenatal vitamin w/FE, FA (PRENATAL 1 + 1) 27-1 MG TABS tablet, Take 1 tablet by mouth daily at 12 noon., Disp: 30 each, Rfl: 0 No Known Allergies  History  Substance Use Topics  . Smoking status: Never Smoker   . Smokeless tobacco: Never Used  . Alcohol Use: No    Family History  Problem Relation Age of Onset  . Alcohol  abuse Neg Hx   . Arthritis Neg Hx   . Asthma Neg Hx   . Birth defects Neg Hx   . COPD Neg Hx   . Depression Neg Hx   . Diabetes Neg Hx   . Drug abuse Neg Hx   . Early death Neg Hx   . Hearing loss Neg Hx   . Heart disease Neg Hx   . Hyperlipidemia Neg Hx   . Kidney disease Neg Hx   . Learning disabilities Neg Hx   . Mental illness Neg Hx   . Mental retardation Neg Hx   . Miscarriages / Stillbirths Neg Hx   . Stroke Neg Hx   . Vision loss Neg Hx   . Other Neg Hx   . Hypertension Mother   . Cancer Paternal Uncle       Review of Systems  Constitutional: negative for fatigue and weight loss Respiratory: negative for cough and wheezing Cardiovascular:  negative for chest pain, fatigue and palpitations Gastrointestinal: negative for abdominal pain and change in bowel habits Musculoskeletal:negative for myalgias Neurological: negative for gait problems and tremors Behavioral/Psych: negative for abusive relationship, depression Endocrine: negative for temperature intolerance   Genitourinary:negative for abnormal menstrual periods, genital lesions, hot flashes, sexual problems and vaginal discharge Integument/breast: negative for breast lump, breast tenderness, nipple discharge and skin lesion(s)    Objective:       BP 107/81 mmHg  Pulse 74  Temp(Src) 97.8 F (36.6 C)  Ht  (1.549 m)  Wt 122 lb (55.339 kg)  BMI 23.06 kg/m2  LMP 01/30/2015  Breastfeeding? No General:   alert  Skin:   no rash or abnormalities  Lungs:   clear to auscultation bilaterally  Heart:   regular rate and rhythm, S1, S2 normal, no murmur, click, rub or gallop  Breasts:   normal without suspicious masses, skin or nipple changes or axillary nodes  Abdomen:  normal findings: no organomegaly, soft, non-tender and no hernia  Pelvis:  External genitalia: normal general appearance Urinary system: urethral meatus normal and bladder without fullness, nontender Vaginal: normal without tenderness, induration or masses Cervix: normal appearance Adnexa: normal bimanual exam Uterus: anteverted and non-tender, normal size   Lab Review Urine pregnancy test Labs reviewed yes Radiologic studies reviewed no  Assessment:    Healthy female exam.    Preconception Counseling   Plan:    Education reviewed: self breast exams and weight bearing exercise. Follow up in: 1 year.   Meds ordered this encounter  Medications  . prenatal vitamin w/FE, FA (PRENATAL 1 + 1) 27-1 MG TABS tablet    Sig: Take 1 tablet by mouth daily at 12 noon.    Dispense:  30 each    Refill:  0   No orders of the defined types were placed in this encounter.

## 2015-02-13 LAB — SURESWAB, VAGINOSIS/VAGINITIS PLUS
Atopobium vaginae: NOT DETECTED Log (cells/mL)
C. PARAPSILOSIS, DNA: NOT DETECTED
C. TROPICALIS, DNA: NOT DETECTED
C. albicans, DNA: NOT DETECTED
C. glabrata, DNA: NOT DETECTED
C. trachomatis RNA, TMA: NOT DETECTED
Gardnerella vaginalis: NOT DETECTED Log (cells/mL)
LACTOBACILLUS SPECIES: NOT DETECTED Log (cells/mL)
MEGASPHAERA SPECIES: NOT DETECTED Log (cells/mL)
N. gonorrhoeae RNA, TMA: NOT DETECTED
T. vaginalis RNA, QL TMA: NOT DETECTED

## 2015-02-13 LAB — PAP IG AND HPV HIGH-RISK: HPV DNA HIGH RISK: NOT DETECTED

## 2015-07-04 ENCOUNTER — Other Ambulatory Visit (INDEPENDENT_AMBULATORY_CARE_PROVIDER_SITE_OTHER): Payer: Medicaid Other

## 2015-07-04 VITALS — BP 101/69 | HR 76 | Temp 98.4°F | Ht 61.0 in | Wt 125.0 lb

## 2015-07-04 DIAGNOSIS — Z3201 Encounter for pregnancy test, result positive: Secondary | ICD-10-CM

## 2015-07-04 DIAGNOSIS — Z3200 Encounter for pregnancy test, result unknown: Secondary | ICD-10-CM

## 2015-07-04 LAB — POCT URINE PREGNANCY: Preg Test, Ur: POSITIVE — AB

## 2015-07-05 ENCOUNTER — Encounter: Payer: Self-pay | Admitting: Obstetrics

## 2015-07-19 ENCOUNTER — Ambulatory Visit (INDEPENDENT_AMBULATORY_CARE_PROVIDER_SITE_OTHER): Payer: 59 | Admitting: Obstetrics

## 2015-07-19 ENCOUNTER — Encounter: Payer: Self-pay | Admitting: Obstetrics

## 2015-07-19 VITALS — BP 106/75 | HR 79 | Temp 98.2°F | Wt 124.0 lb

## 2015-07-19 DIAGNOSIS — O200 Threatened abortion: Secondary | ICD-10-CM

## 2015-07-19 DIAGNOSIS — Z3491 Encounter for supervision of normal pregnancy, unspecified, first trimester: Secondary | ICD-10-CM

## 2015-07-19 MED ORDER — VITAFOL ULTRA 29-0.6-0.4-200 MG PO CAPS: 1.0000 | ORAL_CAPSULE | Freq: Every day | ORAL | Status: DC

## 2015-07-19 NOTE — Progress Notes (Signed)
Patient ID: Savannah Edwards, female   DOB: [birth year], 39 y.o.   MRN: [id number]  Chief Complaint  Patient presents with  . Vaginal Bleeding    spotting in early pregnancy, appox 7 weeks    HPI Savannah Edwards is a 39 y.o. female.  Vaginal bleeding.  No cramping.  HPI  Past Medical History  Diagnosis Date  . No pertinent past medical history   . Gestational diabetes     diet controlled    Past Surgical History  Procedure Laterality Date  . Dilation and curettage of uterus      Family History  Problem Relation Age of Onset  . Alcohol abuse Neg Hx   . Arthritis Neg Hx   . Asthma Neg Hx   . Birth defects Neg Hx   . COPD Neg Hx   . Depression Neg Hx   . Diabetes Neg Hx   . Drug abuse Neg Hx   . Early death Neg Hx   . Hearing loss Neg Hx   . Heart disease Neg Hx   . Hyperlipidemia Neg Hx   . Kidney disease Neg Hx   . Learning disabilities Neg Hx   . Mental illness Neg Hx   . Mental retardation Neg Hx   . Miscarriages / Stillbirths Neg Hx   . Stroke Neg Hx   . Vision loss Neg Hx   . Other Neg Hx   . Hypertension Mother   . Cancer Paternal Uncle     Social History Social History  Substance Use Topics  . Smoking status: Never Smoker   . Smokeless tobacco: Never Used  . Alcohol Use: No    No Known Allergies  Current Outpatient Prescriptions  Medication Sig Dispense Refill  . Cholecalciferol (VITAMIN D-3) 1000 UNITS CAPS Take 1 capsule by mouth daily.     . folic acid (FOLVITE) 400 MCG tablet Take 400 mcg by mouth daily.    . Prenatal Vit-Fe Fumarate-FA (PRENATAL MULTIVITAMIN) TABS tablet Take 1 tablet by mouth daily. 90 tablet 3  . citalopram (CELEXA) 20 MG tablet Take 1 tablet (20 mg total) by mouth daily before breakfast. (Patient not taking: Reported on 02/09/2015) 30 tablet 5  . ibuprofen (ADVIL,MOTRIN) 600 MG tablet TAKE 1 TABLET BY MOUTH EVERY 6 HOURS (Patient not taking: Reported on 02/09/2015) 385 tablet 5  . Iron-FA-B Cmp-C-Biot-Probiotic (FUSION PLUS) CAPS Take  1 capsule by mouth daily before breakfast. (Patient not taking: Reported on 02/09/2015) 30 capsule 5  . oxyCODONE-acetaminophen (PERCOCET/ROXICET) 5-325 MG per tablet Take 1 tablet by mouth every 4 (four) hours as needed (for pain scale less than 7). (Patient not taking: Reported on 02/09/2015) 40 tablet 0  . Prenat-Fe Poly-Methfol-FA-DHA (VITAFOL ULTRA) 29-0.6-0.4-200 MG CAPS Take 1 capsule by mouth daily before breakfast. 90 capsule 3  . prenatal vitamin w/FE, FA (PRENATAL 1 + 1) 27-1 MG TABS tablet Take 1 tablet by mouth daily at 12 noon. 30 each 0   No current facility-administered medications for this visit.    Review of Systems Review of Systems Constitutional: negative for fatigue and weight loss Respiratory: negative for cough and wheezing Cardiovascular: negative for chest pain, fatigue and palpitations Gastrointestinal: negative for abdominal pain and change in bowel habits Genitourinary: vaginal bleeding Integument/breast: negative for nipple discharge Musculoskeletal:negative for myalgias Neurological: negative for gait problems and tremors Behavioral/Psych: negative for abusive relationship, depression Endocrine: negative for temperature intolerance     Blood pressure 106/75, pulse 79, temperature 98.2 F (36.8 C), weight 124 lb (  56.246 kg), last menstrual period 05/26/2015, not currently breastfeeding.  Physical Exam Physical Exam General:   alert  Skin:   no rash or abnormalities  Lungs:   clear to auscultation bilaterally  Heart:   regular rate and rhythm, S1, S2 normal, no murmur, click, rub or gallop  Breasts:   normal without suspicious masses, skin or nipple changes or axillary nodes  Abdomen:  normal findings: no organomegaly, soft, non-tender and no hernia  Pelvis:  External genitalia: normal general appearance Urinary system: urethral meatus normal and bladder without fullness, nontender Vaginal: normal without tenderness, induration or masses Cervix: normal  appearance Adnexa: normal bimanual exam Uterus: anteverted and non-tender, normal size      Data Reviewed Labs  Assessment     1st trimester pregnancy.  Threatened abortion.  Stable.     Plan    NOB appointment scheduled.   Orders Placed This Encounter  Procedures  . SureSwab, Vaginosis/Vaginitis Plus  . US OB Comp Less 14 Wks    Standing Status: Future     Number of Occurrences:      Standing Expiration Date: 09/17/2016    Order Specific Question:  Reason for Exam (SYMPTOM  OR DIAGNOSIS REQUIRED)    Answer:  threatened abortion    Order Specific Question:  Preferred imaging location?    Answer:  [facility]   Meds ordered this encounter  Medications  . Prenat-Fe Poly-Methfol-FA-DHA (VITAFOL ULTRA) 29-0.6-0.4-200 MG CAPS    Sig: Take 1 capsule by mouth daily before breakfast.    Dispense:  90 capsule    Refill:  3

## 2015-07-20 ENCOUNTER — Encounter: Payer: Self-pay | Admitting: Obstetrics

## 2015-07-23 LAB — SURESWAB, VAGINOSIS/VAGINITIS PLUS
Atopobium vaginae: NOT DETECTED Log (cells/mL)
C. ALBICANS, DNA: NOT DETECTED
C. TRACHOMATIS RNA, TMA: NOT DETECTED
C. glabrata, DNA: NOT DETECTED
C. parapsilosis, DNA: NOT DETECTED
C. tropicalis, DNA: NOT DETECTED
Gardnerella vaginalis: NOT DETECTED Log (cells/mL)
LACTOBACILLUS SPECIES: NOT DETECTED Log (cells/mL)
MEGASPHAERA SPECIES: NOT DETECTED Log (cells/mL)
N. gonorrhoeae RNA, TMA: NOT DETECTED
T. VAGINALIS RNA, QL TMA: NOT DETECTED

## 2015-07-25 ENCOUNTER — Ambulatory Visit (HOSPITAL_COMMUNITY)
Admission: RE | Admit: 2015-07-25 | Discharge: 2015-07-25 | Disposition: A | Discharge status: Home / Self Care | Payer: Medicaid Other | Source: Ambulatory Visit | Attending: Obstetrics | Admitting: Obstetrics

## 2015-07-25 DIAGNOSIS — O200 Threatened abortion: Secondary | ICD-10-CM | POA: Diagnosis not present

## 2015-07-25 DIAGNOSIS — Z3A01 Less than 8 weeks gestation of pregnancy: Secondary | ICD-10-CM | POA: Insufficient documentation

## 2015-08-09 ENCOUNTER — Encounter: Payer: Self-pay | Admitting: Obstetrics

## 2015-08-09 ENCOUNTER — Ambulatory Visit (INDEPENDENT_AMBULATORY_CARE_PROVIDER_SITE_OTHER): Payer: Medicaid Other | Admitting: Obstetrics

## 2015-08-09 VITALS — BP 106/78 | HR 76 | Wt 124.0 lb

## 2015-08-09 DIAGNOSIS — Z8632 Personal history of gestational diabetes: Secondary | ICD-10-CM

## 2015-08-09 DIAGNOSIS — O2621 Pregnancy care for patient with recurrent pregnancy loss, first trimester: Secondary | ICD-10-CM | POA: Diagnosis not present

## 2015-08-09 DIAGNOSIS — Z331 Pregnant state, incidental: Secondary | ICD-10-CM | POA: Diagnosis not present

## 2015-08-09 DIAGNOSIS — Z1389 Encounter for screening for other disorder: Secondary | ICD-10-CM

## 2015-08-09 DIAGNOSIS — O200 Threatened abortion: Secondary | ICD-10-CM

## 2015-08-09 DIAGNOSIS — O09521 Supervision of elderly multigravida, first trimester: Secondary | ICD-10-CM | POA: Diagnosis not present

## 2015-08-09 DIAGNOSIS — Z3481 Encounter for supervision of other normal pregnancy, first trimester: Secondary | ICD-10-CM | POA: Diagnosis not present

## 2015-08-09 LAB — POCT URINALYSIS DIPSTICK
Bilirubin, UA: NEGATIVE
Glucose, UA: NEGATIVE
KETONES UA: NEGATIVE
Leukocytes, UA: NEGATIVE
Nitrite, UA: NEGATIVE
PROTEIN UA: NEGATIVE
RBC UA: 250
SPEC GRAV UA: 1.02
Urobilinogen, UA: NEGATIVE
pH, UA: 5

## 2015-08-09 NOTE — Addendum Note (Signed)
Addended by: Marya Landry D on: 08/09/2015 02:20 PM   Modules accepted: Orders

## 2015-08-09 NOTE — Progress Notes (Signed)
Subjective:    Savannah Edwards is being seen today for her first obstetrical visit.  This is a planned pregnancy. She is at [redacted]w[redacted]d gestation. Her obstetrical history is significant for advanced maternal age and H/O GDM.  Habitual Aborter.. Relationship with FOB: spouse, living together. Patient does intend to breast feed. Pregnancy history fully reviewed.  The information documented in the HPI was reviewed and verified.  Menstrual History: OB History    Gravida Para Term Preterm AB TAB SAB Ectopic Multiple Living   Patient's last menstrual period was 05/28/2015.    Past Medical History  Diagnosis Date  . No pertinent past medical history   . Gestational diabetes     diet controlled    Past Surgical History  Procedure Laterality Date  . Dilation and curettage of uterus       (Not in a hospital admission) No Known Allergies  Social History  Substance Use Topics  . Smoking status: Never Smoker   . Smokeless tobacco: Never Used  . Alcohol Use: No    Family History  Problem Relation Age of Onset  . Alcohol abuse Neg Hx   . Arthritis Neg Hx   . Asthma Neg Hx   . Birth defects Neg Hx   . COPD Neg Hx   . Depression Neg Hx   . Diabetes Neg Hx   . Drug abuse Neg Hx   . Early death Neg Hx   . Hearing loss Neg Hx   . Heart disease Neg Hx   . Hyperlipidemia Neg Hx   . Kidney disease Neg Hx   . Learning disabilities Neg Hx   . Mental illness Neg Hx   . Mental retardation Neg Hx   . Miscarriages / Stillbirths Neg Hx   . Stroke Neg Hx   . Vision loss Neg Hx   . Other Neg Hx   . Hypertension Mother   . Cancer Paternal Uncle      Review of Systems Constitutional: negative for weight loss Gastrointestinal: negative for vomiting Genitourinary:negative for genital lesions and vaginal discharge and dysuria Musculoskeletal:negative for back pain Behavioral/Psych: negative for abusive relationship, depression, illegal drug usage and tobacco use     Objective:    BP 106/78 mmHg  Pulse 76  Wt 124 lb (56.246 kg)  LMP 05/28/2015 General Appearance:    Alert, cooperative, no distress, appears stated age  Head:    Normocephalic, without obvious abnormality, atraumatic  Eyes:    PERRL, conjunctiva/corneas clear, EOM's intact, fundi    benign, both eyes  Ears:    Normal TM's and external ear canals, both ears  Nose:   Nares normal, septum midline, mucosa normal, no drainage    or sinus tenderness  Throat:   Lips, mucosa, and tongue normal; teeth and gums normal  Neck:   Supple, symmetrical, trachea midline, no adenopathy;    thyroid:  no enlargement/tenderness/nodules; no carotid   bruit or JVD  Back:     Symmetric, no curvature, ROM normal, no CVA tenderness  Lungs:     Clear to auscultation bilaterally, respirations unlabored  Chest Wall:    No tenderness or deformity   Heart:    Regular rate and rhythm, S1 and S2 normal, no murmur, rub   or gallop  Breast Exam:    No tenderness, masses, or nipple abnormality  Abdomen:     Soft, non-tender, bowel sounds active all  four quadrants,    no masses, no organomegaly  Genitalia:    Normal female without lesion, discharge or tenderness  Extremities:   Extremities normal, atraumatic, no cyanosis or edema  Pulses:   2+ and symmetric all extremities  Skin:   Skin color, texture, turgor normal, no rashes or lesions  Lymph nodes:   Cervical, supraclavicular, and axillary nodes normal  Neurologic:   CNII-XII intact, normal strength, sensation and reflexes    throughout      Lab Review Urine pregnancy test Labs reviewed yes Radiologic studies reviewed yes Assessment:    Pregnancy at [redacted]w[redacted]d weeks    AMA  Habitual Aborter  H/O GDM   Plan:   Referred to MFM for Genetic Counseling, Ultrasound and Co-Management    Prenatal vitamins.  Counseling provided regarding continued use of seat belts, cessation of alcohol consumption, smoking or use of illicit drugs; infection precautions i.e.,  influenza/TDAP immunizations, toxoplasmosis,CMV, parvovirus, listeria and varicella; workplace safety, exercise during pregnancy; routine dental care, safe medications, sexual activity, hot tubs, saunas, pools, travel, caffeine use, fish and methlymercury, potential toxins, hair treatments, varicose veins Weight gain recommendations per IOM guidelines reviewed: underweight/BMI< 18.5--> gain 28 - 40 lbs; normal weight/BMI 18.5 - 24.9--> gain 25 - 35 lbs; overweight/BMI 25 - 29.9--> gain 15 - 25 lbs; obese/BMI >30->gain  11 - 20 lbs Problem list reviewed and updated. FIRST/CF mutation testing/NIPT/QUAD SCREEN/fragile X/[name] population testing/Spinal muscular atrophy discussed: requested. Role of ultrasound in pregnancy discussed; fetal survey: requested. Amniocentesis discussed: undecided. VBAC calculator score: VBAC consent form provided No orders of the defined types were placed in this encounter.   Orders Placed This Encounter  Procedures  . Culture, OB Urine  . US OB Comp Less 14 Wks    Standing Status: Future     Number of Occurrences:      Standing Expiration Date: 10/08/2016    Order Specific Question:  Reason for Exam (SYMPTOM  OR DIAGNOSIS REQUIRED)    Answer:  AMA    Order Specific Question:  Preferred imaging location?    Answer:  [facility]  . Hemoglobin A1c  . Obstetric panel  . HIV antibody  . Hemoglobinopathy evaluation  . Varicella zoster antibody, IgG  . Vit D  25 hydroxy (rtn osteoporosis monitoring)  . AMB Referral to Maternal Fetal Medicine (MFM)    Referral Priority:  Routine    Referral Type:  Consultation    Number of Visits Requested:  1    Follow up in 4 weeks.

## 2015-08-10 ENCOUNTER — Ambulatory Visit (INDEPENDENT_AMBULATORY_CARE_PROVIDER_SITE_OTHER): Payer: Medicaid Other

## 2015-08-10 ENCOUNTER — Ambulatory Visit (INDEPENDENT_AMBULATORY_CARE_PROVIDER_SITE_OTHER): Payer: Medicaid Other | Admitting: Certified Nurse Midwife

## 2015-08-10 VITALS — BP 108/73 | HR 82 | Wt 126.0 lb

## 2015-08-10 DIAGNOSIS — Z3491 Encounter for supervision of normal pregnancy, unspecified, first trimester: Secondary | ICD-10-CM

## 2015-08-10 DIAGNOSIS — O020 Blighted ovum and nonhydatidiform mole: Secondary | ICD-10-CM | POA: Diagnosis not present

## 2015-08-10 DIAGNOSIS — Z331 Pregnant state, incidental: Secondary | ICD-10-CM

## 2015-08-10 DIAGNOSIS — O209 Hemorrhage in early pregnancy, unspecified: Secondary | ICD-10-CM

## 2015-08-10 DIAGNOSIS — Z1389 Encounter for screening for other disorder: Secondary | ICD-10-CM | POA: Diagnosis not present

## 2015-08-10 LAB — OBSTETRIC PANEL
ANTIBODY SCREEN: NEGATIVE
Basophils Absolute: 0 10*3/uL (ref 0.0–0.1)
Basophils Relative: 0 % (ref 0–1)
EOS ABS: 0.1 10*3/uL (ref 0.0–0.7)
Eosinophils Relative: 1 % (ref 0–5)
HCT: 38.6 % (ref 36.0–46.0)
HEP B S AG: NEGATIVE
Hemoglobin: 12.8 g/dL (ref 12.0–15.0)
Lymphocytes Relative: 35 % (ref 12–46)
Lymphs Abs: 2.3 10*3/uL (ref 0.7–4.0)
MCH: 31.2 pg (ref 26.0–34.0)
MCHC: 33.2 g/dL (ref 30.0–36.0)
MCV: 94.1 fL (ref 78.0–100.0)
MONO ABS: 0.2 10*3/uL (ref 0.1–1.0)
MPV: 8.8 fL (ref 8.6–12.4)
Monocytes Relative: 3 % (ref 3–12)
NEUTROS ABS: 4 10*3/uL (ref 1.7–7.7)
Neutrophils Relative %: 61 % (ref 43–77)
Platelets: 317 10*3/uL (ref 150–400)
RBC: 4.1 MIL/uL (ref 3.87–5.11)
RDW: 13.3 % (ref 11.5–15.5)
RH TYPE: POSITIVE
Rubella: 7.06 Index — ABNORMAL HIGH (ref <0.90)
WBC: 6.6 10*3/uL (ref 4.0–10.5)

## 2015-08-10 LAB — POCT URINALYSIS DIPSTICK
Bilirubin, UA: NEGATIVE
Glucose, UA: NEGATIVE
KETONES UA: NEGATIVE
LEUKOCYTES UA: NEGATIVE
Nitrite, UA: NEGATIVE
PROTEIN UA: NEGATIVE
RBC UA: 250
Spec Grav, UA: 1.015
Urobilinogen, UA: NEGATIVE
pH, UA: 5.5

## 2015-08-10 LAB — VARICELLA ZOSTER ANTIBODY, IGG: Varicella IgG: 794.4 Index — ABNORMAL HIGH (ref <135.00)

## 2015-08-10 LAB — VITAMIN D 25 HYDROXY (VIT D DEFICIENCY, FRACTURES): Vit D, 25-Hydroxy: 22 ng/mL — ABNORMAL LOW (ref 30–100)

## 2015-08-10 LAB — HIV ANTIBODY (ROUTINE TESTING W REFLEX): HIV 1&2 Ab, 4th Generation: NONREACTIVE

## 2015-08-10 LAB — HEMOGLOBIN A1C
HEMOGLOBIN A1C: 5.9 % — AB (ref <5.7)
MEAN PLASMA GLUCOSE: 123 mg/dL — AB (ref <117)

## 2015-08-10 NOTE — Progress Notes (Signed)
Patient ID: Savannah Edwards, female   DOB: Feb 12, 1976, 39 y.o.   MRN: [id number]  Chief Complaint  Patient presents with  . Routine Prenatal Visit    HPI Savannah Edwards is a 39 y.o. female.  Was here yesterday for her initial ob visit.  Had spotting overnight and today.  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  This was the first pregnancy since having her daughter in 49.  Patient went for [place] in office.  Blighted ovum seen.  Results discussed with patient.  All questions answered, spouse present.  Patient is [place].    HPI  Past Medical History  Diagnosis Date  . No pertinent past medical history   . Gestational diabetes     diet controlled    Past Surgical History  Procedure Laterality Date  . Dilation and curettage of uterus      Family History  Problem Relation Age of Onset  . Alcohol abuse Neg Hx   . Arthritis Neg Hx   . Asthma Neg Hx   . Birth defects Neg Hx   . COPD Neg Hx   . Depression Neg Hx   . Diabetes Neg Hx   . Drug abuse Neg Hx   . Early death Neg Hx   . Hearing loss Neg Hx   . Heart disease Neg Hx   . Hyperlipidemia Neg Hx   . Kidney disease Neg Hx   . Learning disabilities Neg Hx   . Mental illness Neg Hx   . Mental retardation Neg Hx   . Miscarriages / Stillbirths Neg Hx   . Stroke Neg Hx   . Vision loss Neg Hx   . Other Neg Hx   . Hypertension Mother   . Cancer Paternal Uncle     Social History Social History  Substance Use Topics  . Smoking status: Never Smoker   . Smokeless tobacco: Never Used  . Alcohol Use: No    No Known Allergies  Current Outpatient Prescriptions  Medication Sig Dispense Refill  . Cholecalciferol (VITAMIN D-3) 1000 UNITS CAPS Take 1 capsule by mouth daily.     . citalopram (CELEXA) 20 MG tablet Take 1 tablet (20 mg total) by mouth daily before breakfast. (Patient not taking: Reported on 02/09/2015) 30 tablet 5  . folic acid (FOLVITE) 400 MCG tablet Take 400 mcg by mouth daily.    Marland Kitchen ibuprofen  (ADVIL,MOTRIN) 600 MG tablet TAKE 1 TABLET BY MOUTH EVERY 6 HOURS (Patient not taking: Reported on 02/09/2015) 385 tablet 5  . Iron-FA-B Cmp-C-Biot-Probiotic (FUSION PLUS) CAPS Take 1 capsule by mouth daily before breakfast. (Patient not taking: Reported on 02/09/2015) 30 capsule 5  . Prenat-Fe Poly-Methfol-FA-DHA (VITAFOL ULTRA) 29-0.6-0.4-200 MG CAPS Take 1 capsule by mouth daily before breakfast. 90 capsule 3   No current facility-administered medications for this visit.    Review of Systems Review of Systems Constitutional: negative for fatigue and weight loss Respiratory: negative for cough and wheezing Cardiovascular: negative for chest pain, fatigue and palpitations Gastrointestinal: negative for abdominal pain and change in bowel habits Genitourinary:negative Integument/breast: negative for nipple discharge Musculoskeletal:negative for myalgias Neurological: negative for gait problems and tremors Behavioral/Psych: negative for abusive relationship, depression Endocrine: negative for temperature intolerance     Blood pressure 108/73, pulse 82, weight 126 lb (57.153 kg), last menstrual period 05/28/2015, not currently breastfeeding.  Physical Exam Physical Exam General:   alert  Skin:   no rash or abnormalities  Lungs:   clear to auscultation bilaterally  Heart:   regular rate and rhythm, S1, S2 normal, no murmur, click, rub or gallop  Breasts:   normal without suspicious masses, skin or nipple changes or axillary nodes  Abdomen:  normal findings: no organomegaly, soft, non-tender and no hernia  Pelvis:  External genitalia: normal general appearance Urinary system: urethral meatus normal and bladder without fullness, nontender Vaginal: normal without tenderness, induration or masses Cervix: normal appearance Adnexa: normal bimanual exam Uterus: anteverted and non-tender, normal size    100% of 15 min visit spent on counseling and coordination of care.   Data  Reviewed Previous medical hx, labs, meds  Assessment     Blighted ovum, miscarriage immanent.       Plan   Serum HCG quant levels   D&C in 2 weeks if no spontaneous miscarriage  Orders Placed This Encounter  Procedures  . US OB Comp Less 14 Wks    Standing Status: Future     Number of Occurrences: 1     Standing Expiration Date: 10/09/2016    Order Specific Question:  Reason for Exam (SYMPTOM  OR DIAGNOSIS REQUIRED)    Answer:  bleeding first trimester    Order Specific Question:  Preferred imaging location?    Answer:  Internal  . hCG, quantitative, pregnancy  . POCT urinalysis dipstick   No orders of the defined types were placed in this encounter.    F/U Monday for another quant.

## 2015-08-11 LAB — HEMOGLOBINOPATHY EVALUATION
HGB A: 97.5 % (ref 96.8–97.8)
HGB F QUANT: 0 % (ref 0.0–2.0)
Hemoglobin Other: 0 %
Hgb A2 Quant: 2.5 % (ref 2.2–3.2)
Hgb S Quant: 0 %

## 2015-08-11 LAB — CULTURE, OB URINE
Colony Count: NO GROWTH
ORGANISM ID, BACTERIA: NO GROWTH

## 2015-08-11 LAB — HCG, QUANTITATIVE, PREGNANCY: HCG, BETA CHAIN, QUANT, S: 24751.9 m[IU]/mL

## 2015-08-14 ENCOUNTER — Other Ambulatory Visit: Payer: 59

## 2015-08-14 DIAGNOSIS — O020 Blighted ovum and nonhydatidiform mole: Secondary | ICD-10-CM

## 2015-08-15 LAB — HCG, QUANTITATIVE, PREGNANCY: HCG, BETA CHAIN, QUANT, S: 19173.7 m[IU]/mL

## 2015-08-16 ENCOUNTER — Telehealth: Payer: Self-pay | Admitting: *Deleted

## 2015-08-16 NOTE — Telephone Encounter (Signed)
Call to patient to review her lab results and check on her status. Patient states she started bleeding yesterday and she is experiencing heavy bleeding today. She is tearful. Told patient hopefully her bleeding will decrease over the next 24 hours and the heaviest will subside to a lighter flow. Told patient to keep her follow up appointment and call the office if she needs anything.

## 2015-08-24 ENCOUNTER — Encounter: Payer: Self-pay | Admitting: Obstetrics

## 2015-08-24 ENCOUNTER — Ambulatory Visit (INDEPENDENT_AMBULATORY_CARE_PROVIDER_SITE_OTHER): Payer: Medicaid Other | Admitting: Obstetrics

## 2015-08-24 ENCOUNTER — Encounter: Payer: Self-pay | Admitting: Certified Nurse Midwife

## 2015-08-24 VITALS — BP 107/82 | HR 77 | Wt 124.0 lb

## 2015-08-24 DIAGNOSIS — N96 Recurrent pregnancy loss: Secondary | ICD-10-CM

## 2015-08-24 NOTE — Progress Notes (Signed)
Patient ID: Savannah Edwards, female   DOB: 02/24/1976, 39 y.o.   MRN: [id number]  Chief Complaint  Patient presents with  . Follow-up    follow up - SAB    HPI Savannah Edwards is a 39 y.o. female.  F/U visit after SAB.  No complaints.  HPI  Past Medical History  Diagnosis Date  . No pertinent past medical history   . Gestational diabetes     diet controlled    Past Surgical History  Procedure Laterality Date  . Dilation and curettage of uterus      Family History  Problem Relation Age of Onset  . Alcohol abuse Neg Hx   . Arthritis Neg Hx   . Asthma Neg Hx   . Birth defects Neg Hx   . COPD Neg Hx   . Depression Neg Hx   . Diabetes Neg Hx   . Drug abuse Neg Hx   . Early death Neg Hx   . Hearing loss Neg Hx   . Heart disease Neg Hx   . Hyperlipidemia Neg Hx   . Kidney disease Neg Hx   . Learning disabilities Neg Hx   . Mental illness Neg Hx   . Mental retardation Neg Hx   . Miscarriages / Stillbirths Neg Hx   . Stroke Neg Hx   . Vision loss Neg Hx   . Other Neg Hx   . Hypertension Mother   . Cancer Paternal Uncle     Social History Social History  Substance Use Topics  . Smoking status: Never Smoker   . Smokeless tobacco: Never Used  . Alcohol Use: No    No Known Allergies  Current Outpatient Prescriptions  Medication Sig Dispense Refill  . Cholecalciferol (VITAMIN D-3) 1000 UNITS CAPS Take 1 capsule by mouth daily.     . citalopram (CELEXA) 20 MG tablet Take 1 tablet (20 mg total) by mouth daily before breakfast. (Patient not taking: Reported on 02/09/2015) 30 tablet 5  . folic acid (FOLVITE) 400 MCG tablet Take 400 mcg by mouth daily.    Marland Kitchen. ibuprofen (ADVIL,MOTRIN) 600 MG tablet TAKE 1 TABLET BY MOUTH EVERY 6 HOURS (Patient not taking: Reported on 02/09/2015) 385 tablet 5  . Iron-FA-B Cmp-C-Biot-Probiotic (FUSION PLUS) CAPS Take 1 capsule by mouth daily before breakfast. (Patient not taking: Reported on 02/09/2015) 30 capsule 5  . Prenat-Fe Poly-Methfol-FA-DHA  (VITAFOL ULTRA) 29-0.6-0.4-200 MG CAPS Take 1 capsule by mouth daily before breakfast. 90 capsule 3   No current facility-administered medications for this visit.    Review of Systems Review of Systems Constitutional: negative for fatigue and weight loss Respiratory: negative for cough and wheezing Cardiovascular: negative for chest pain, fatigue and palpitations Gastrointestinal: negative for abdominal pain and change in bowel habits Genitourinary:negative Integument/breast: negative for nipple discharge Musculoskeletal:negative for myalgias Neurological: negative for gait problems and tremors Behavioral/Psych: negative for abusive relationship, depression Endocrine: negative for temperature intolerance     Blood pressure 107/82, pulse 77, weight 124 lb (56.246 kg), last menstrual period 05/28/2015, unknown if currently breastfeeding.  Physical Exam Physical Exam:  Deferred  100% of 10 min visit spent on counseling and coordination of care.   Data Reviewed Labs  Assessment     SAB, complete.  Doing well.     Plan    Plans to get pregnant soon. F/U prn  Orders Placed This Encounter  Procedures  . Ambulatory referral to Endocrinology    Referral Priority:  Routine    Referral Type:  Consultation    Referral Reason:  Specialty Services Required    Number of Visits Requested:  1   No orders of the defined types were placed in this encounter.

## 2015-09-06 ENCOUNTER — Encounter: Payer: Self-pay | Admitting: Obstetrics

## 2015-09-08 ENCOUNTER — Encounter (HOSPITAL_COMMUNITY): Payer: 59

## 2015-09-08 ENCOUNTER — Ambulatory Visit (HOSPITAL_COMMUNITY): Payer: 59

## 2015-09-08 ENCOUNTER — Other Ambulatory Visit (HOSPITAL_COMMUNITY): Payer: 59

## 2015-09-11 ENCOUNTER — Encounter: Payer: Self-pay | Admitting: Obstetrics

## 2015-11-28 ENCOUNTER — Ambulatory Visit (INDEPENDENT_AMBULATORY_CARE_PROVIDER_SITE_OTHER): Payer: Medicaid Other | Admitting: Obstetrics

## 2015-11-28 ENCOUNTER — Encounter: Payer: Self-pay | Admitting: Obstetrics

## 2015-11-28 VITALS — BP 119/82 | HR 75 | Wt 128.0 lb

## 2015-11-28 DIAGNOSIS — B373 Candidiasis of vulva and vagina: Secondary | ICD-10-CM

## 2015-11-28 DIAGNOSIS — N760 Acute vaginitis: Secondary | ICD-10-CM

## 2015-11-28 DIAGNOSIS — B3731 Acute candidiasis of vulva and vagina: Secondary | ICD-10-CM

## 2015-11-28 MED ORDER — FLUCONAZOLE 150 MG PO TABS: 150.0000 mg | ORAL_TABLET | Freq: Once | ORAL | Status: DC

## 2015-11-28 NOTE — Progress Notes (Signed)
Patient ID: Savannah Edwards, female   DOB: [birth year], 40 y.o.   MRN: [id number]  Chief Complaint  Patient presents with  . Vaginitis    vaginal itch    HPI Savannah Edwards is a 40 y.o. female.  Vaginal itching.  No significant discharge or odor. HPI  Past Medical History  Diagnosis Date  . No pertinent past medical history   . Gestational diabetes     diet controlled    Past Surgical History  Procedure Laterality Date  . Dilation and curettage of uterus      Family History  Problem Relation Age of Onset  . Alcohol abuse Neg Hx   . Arthritis Neg Hx   . Asthma Neg Hx   . Birth defects Neg Hx   . COPD Neg Hx   . Depression Neg Hx   . Diabetes Neg Hx   . Drug abuse Neg Hx   . Early death Neg Hx   . Hearing loss Neg Hx   . Heart disease Neg Hx   . Hyperlipidemia Neg Hx   . Kidney disease Neg Hx   . Learning disabilities Neg Hx   . Mental illness Neg Hx   . Mental retardation Neg Hx   . Miscarriages / Stillbirths Neg Hx   . Stroke Neg Hx   . Vision loss Neg Hx   . Other Neg Hx   . Hypertension Mother   . Cancer Paternal Uncle     Social History Social History  Substance Use Topics  . Smoking status: Never Smoker   . Smokeless tobacco: Never Used  . Alcohol Use: No    No Known Allergies  Current Outpatient Prescriptions  Medication Sig Dispense Refill  . Cholecalciferol (VITAMIN D-3) 1000 UNITS CAPS Take 1 capsule by mouth daily.     . folic acid (FOLVITE) 400 MCG tablet Take 400 mcg by mouth daily.    . Prenat-Fe Poly-Methfol-FA-DHA (VITAFOL ULTRA) 29-0.6-0.4-200 MG CAPS Take 1 capsule by mouth daily before breakfast. 90 capsule 3  . citalopram (CELEXA) 20 MG tablet Take 1 tablet (20 mg total) by mouth daily before breakfast. (Patient not taking: Reported on 02/09/2015) 30 tablet 5  . fluconazole (DIFLUCAN) 150 MG tablet Take 1 tablet (150 mg total) by mouth once. 1 tablet 2  . ibuprofen (ADVIL,MOTRIN) 600 MG tablet TAKE 1 TABLET BY MOUTH EVERY 6 HOURS (Patient not  taking: Reported on 02/09/2015) 385 tablet 5  . Iron-FA-B Cmp-C-Biot-Probiotic (FUSION PLUS) CAPS Take 1 capsule by mouth daily before breakfast. (Patient not taking: Reported on 02/09/2015) 30 capsule 5   No current facility-administered medications for this visit.    Review of Systems Review of Systems Constitutional: negative for fatigue and weight loss Respiratory: negative for cough and wheezing Cardiovascular: negative for chest pain, fatigue and palpitations Gastrointestinal: negative for abdominal pain and change in bowel habits Genitourinary: vaginal itching Integument/breast: negative for nipple discharge Musculoskeletal:negative for myalgias Neurological: negative for gait problems and tremors Behavioral/Psych: negative for abusive relationship, depression Endocrine: negative for temperature intolerance     Blood pressure 119/82, pulse 75, weight 128 lb (58.06 kg), last menstrual period 11/20/2015, unknown if currently breastfeeding.  Physical Exam Physical Exam           General: Alert and no distress Abdomen:  normal findings: no organomegaly, soft, non-tender and no hernia  Pelvis:  External genitalia: normal general appearance Urinary system: urethral meatus normal and bladder without fullness, nontender Vaginal: normal without tenderness, induration or masses Cervix: normal  appearance Adnexa: normal bimanual exam Uterus: anteverted and non-tender, normal size      Data Reviewed Labs  Assessment     Candida vulvovaginitis  Plan:  Diflucan Rx F/U 5 months for Annual and Pap   Orders Placed This Encounter  Procedures  . SureSwab Bacterial Vaginosis/itis   Meds ordered this encounter  Medications  . fluconazole (DIFLUCAN) 150 MG tablet    Sig: Take 1 tablet (150 mg total) by mouth once.    Dispense:  1 tablet    Refill:  2

## 2015-12-01 LAB — SURESWAB BACTERIAL VAGINOSIS/ITIS
ATOPOBIUM VAGINAE: NOT DETECTED Log (cells/mL)
C. albicans, DNA: NOT DETECTED
C. glabrata, DNA: NOT DETECTED
C. parapsilosis, DNA: NOT DETECTED
C. tropicalis, DNA: NOT DETECTED
GARDNERELLA VAGINALIS: NOT DETECTED Log (cells/mL)
LACTOBACILLUS SPECIES: NOT DETECTED Log (cells/mL)
MEGASPHAERA SPECIES: NOT DETECTED Log (cells/mL)
T. vaginalis RNA, QL TMA: NOT DETECTED

## 2015-12-18 ENCOUNTER — Ambulatory Visit: Payer: Medicaid Other | Admitting: Obstetrics

## 2016-01-23 IMAGING — US US OB FOLLOW-UP
1 series · 12 of 28 positions shown · non-contrast
Comparison: none

[Series 1: us ob follow-up · 0.17mm/px · 12 of 60 slices shown]
[im 3/60]
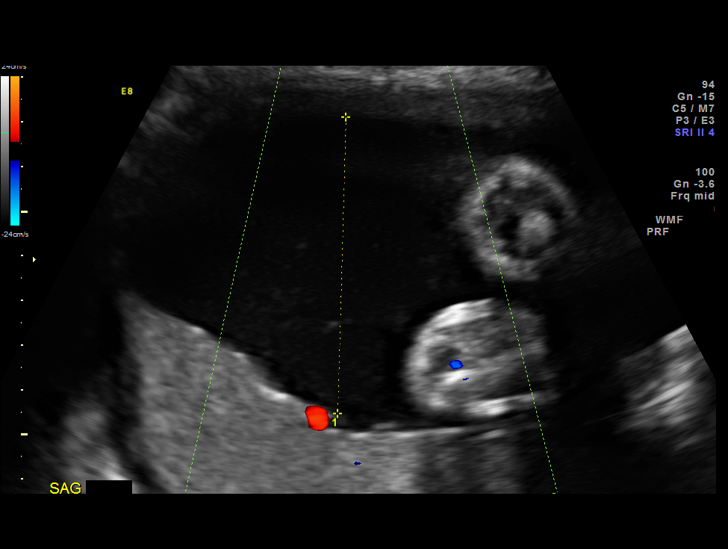
[im 7/60]
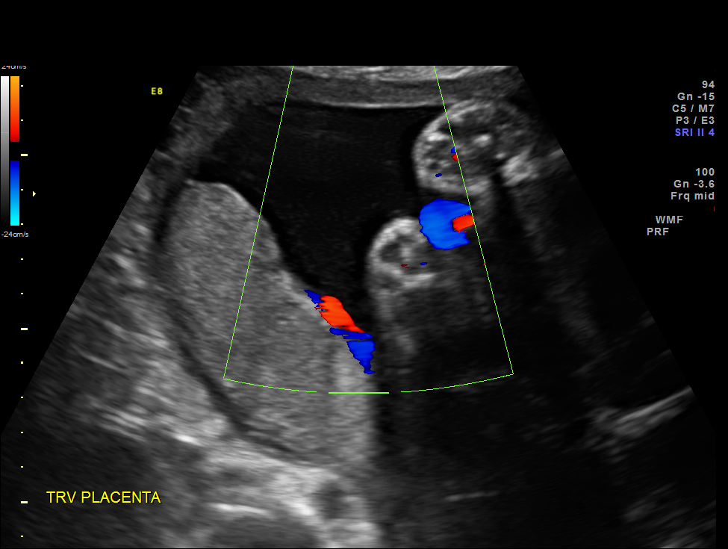
[im 11/60]
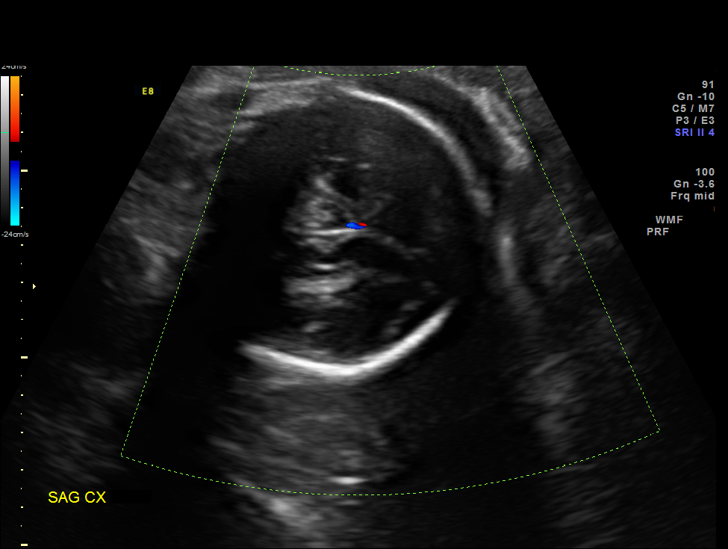
[im 18/60]
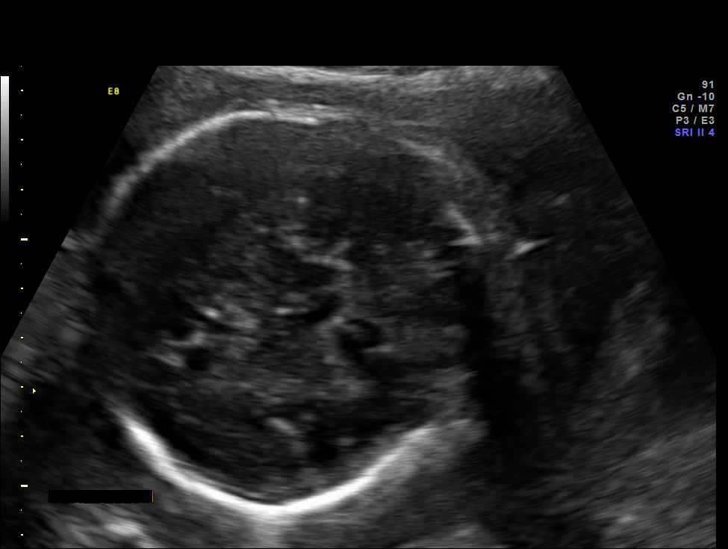
[im 22/60]
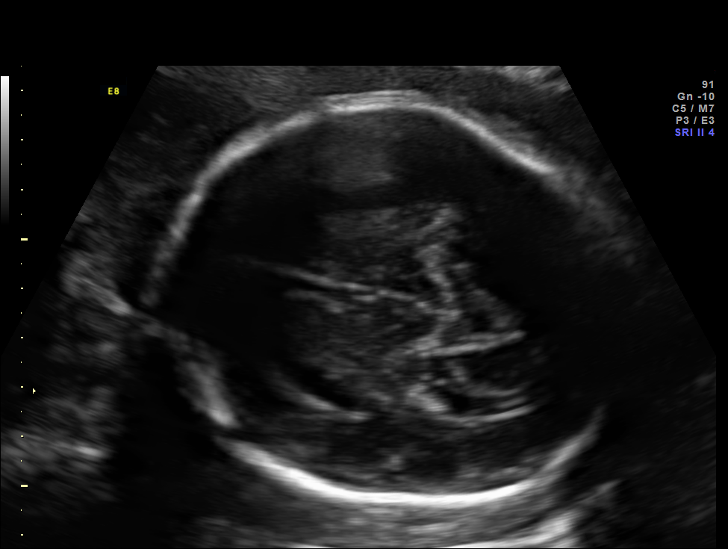
[im 27/60]
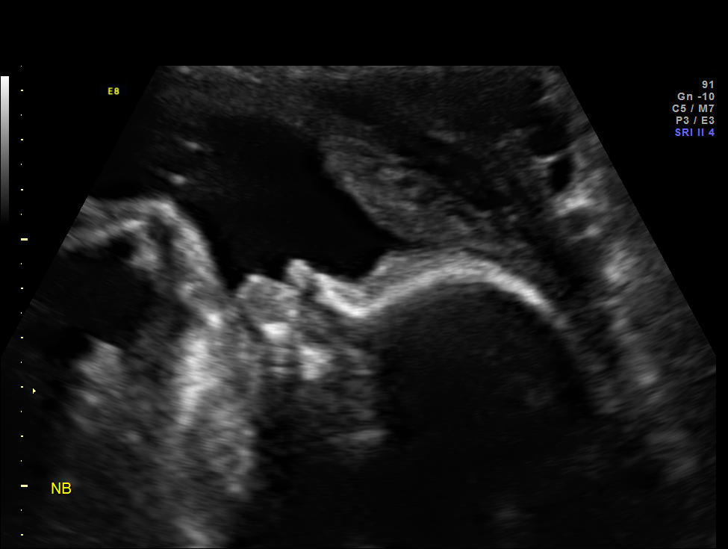
[im 33/60]
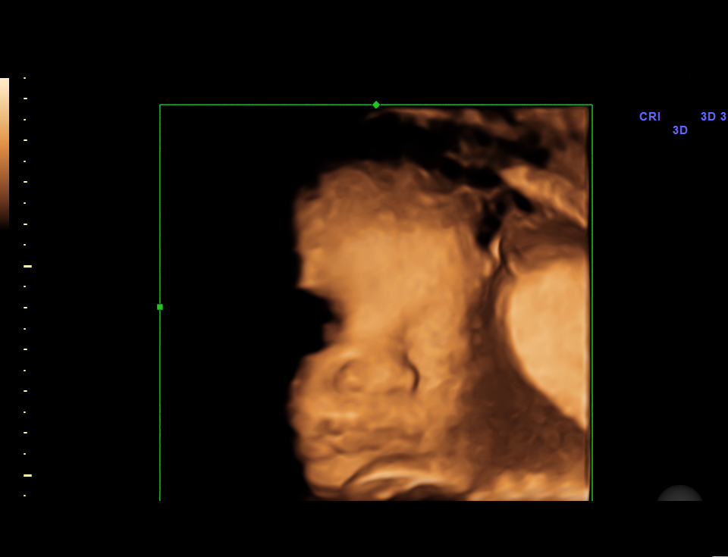
[im 38/60]
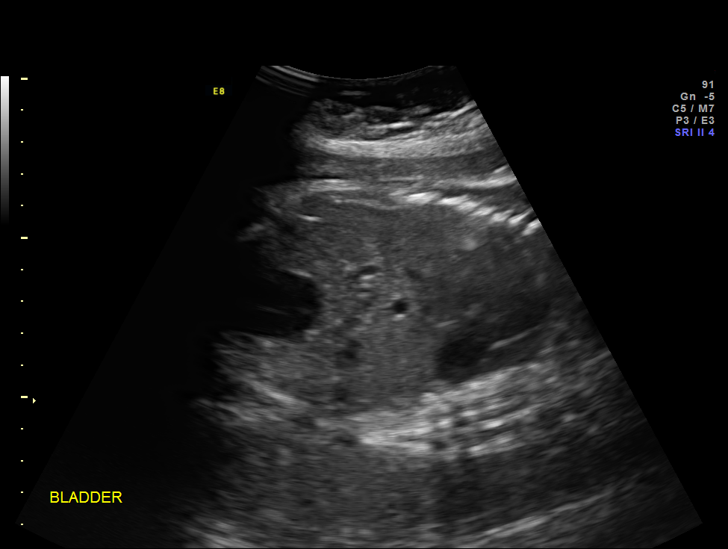
[im 42/60]
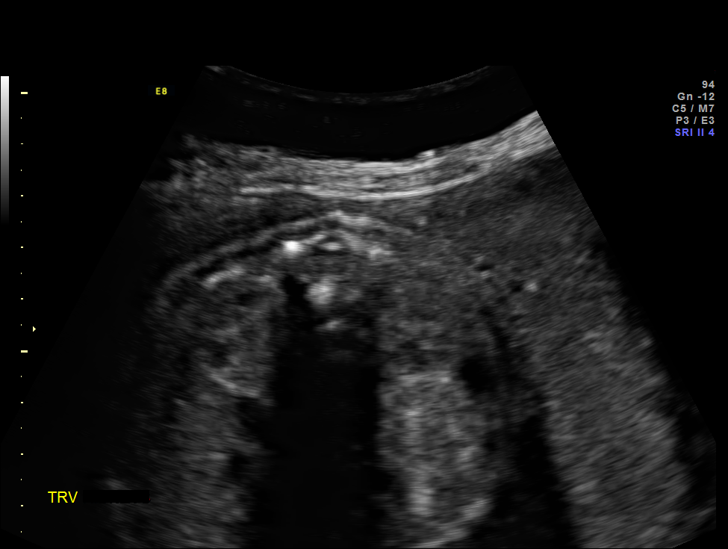
[im 49/60]
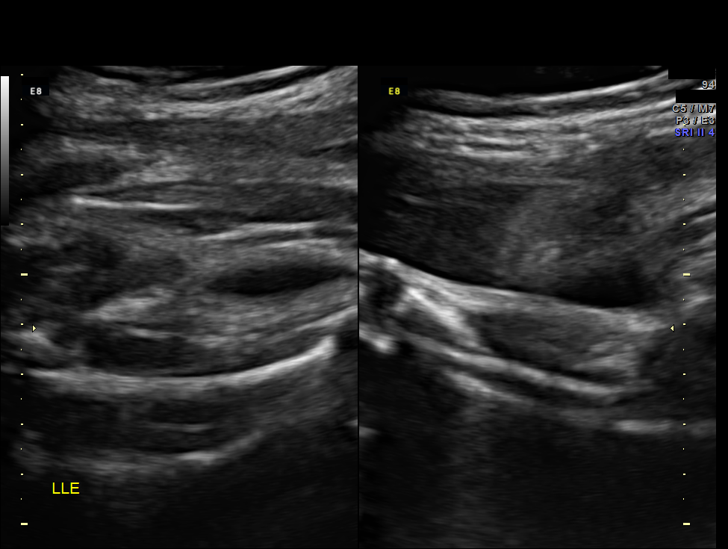
[im 53/60]
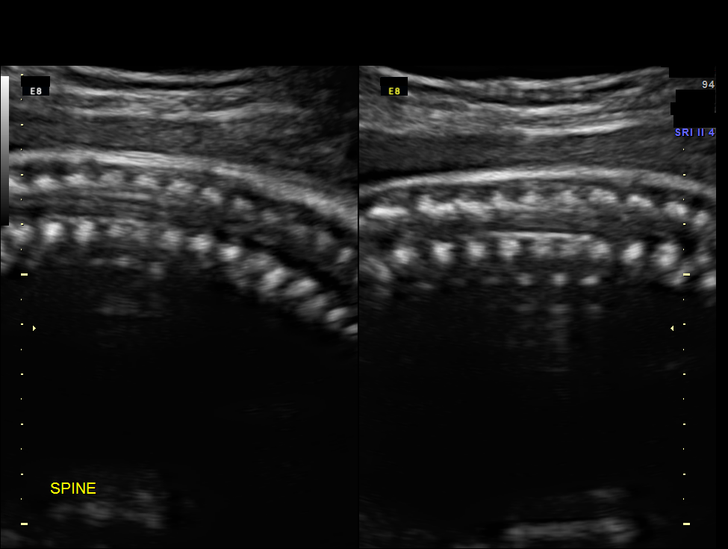
[im 57/60]
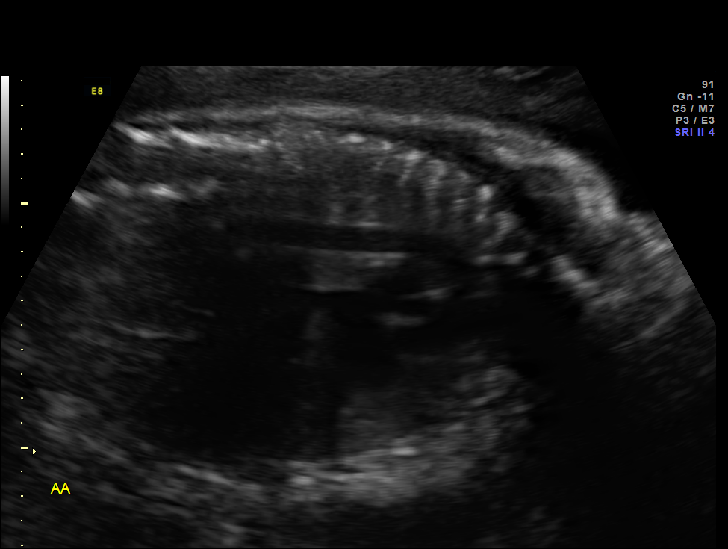

[12 of 28 positions shown; findings below may reference images not displayed]

OBSTETRICS REPORT
                      (Signed Final 04/29/2014 [DATE])

Service(s) Provided

 US OB FOLLOW UP                                       76816.1
Indications

 Advanced maternal age (37), Multigravida - Low
 risk NIPS
 Diabetes - Gestational, A1 (diet controlled)
 Rh negative
 Poor obstetric history-Recurrent (habitual) abortion
 (3 consecutive ab's)
Fetal Evaluation

 Num Of Fetuses:    1
 Fetal Heart Rate:  175                          bpm
 Cardiac Activity:  Observed
 Presentation:      Cephalic
 Placenta:          Posterior, above cervical
                    os
 P. Cord            Visualized, central
 Insertion:

 Amniotic Fluid
 AFI FV:      Subjectively within normal limits
                                             Larg Pckt:     6.7  cm
Biometry

 BPD:     79.9  mm     G. Age:  32w 0d                CI:         82.1   70 - 86
 OFD:     97.3  mm                                    FL/HC:      20.1   19.3 -

 HC:     281.2  mm     G. Age:  30w 6d       10  %    HC/AC:      1.08   0.96 -

 AC:     260.2  mm     G. Age:  30w 1d       21  %    FL/BPD:     70.7   71 - 87
 FL:      56.5  mm     G. Age:  29w 5d        8  %    FL/AC:      21.7   20 - 24

 Est. FW:    3768  gm      3 lb 6 oz     36  %
Gestational Age

 LMP:           31w 1d        Date:  09/23/13                 EDD:   06/30/14
 U/S Today:     30w 5d                                        EDD:   07/03/14
 Best:          31w 1d     Det. By:  LMP  (09/23/13)          EDD:   06/30/14
Anatomy
 Cranium:          Appears normal         Aortic Arch:      Previously seen
 Fetal Cavum:      Previously seen        Ductal Arch:      Previously seen
 Ventricles:       Appears normal         Diaphragm:        Appears normal
 Choroid Plexus:   Previously seen        Stomach:          Appears normal
 Cerebellum:       Previously seen        Abdomen:          Appears normal
 Posterior Fossa:  Previously seen        Abdominal Wall:   Previously seen
 Nuchal Fold:      Previously seen        Cord Vessels:     Previously seen
 Face:             Orbits and profile     Kidneys:          Appear normal
                   previously seen
 Lips:             Previously seen        Bladder:          Appears normal
 Heart:            Previously seen        Spine:            Previously seen
 RVOT:             Previously seen        Lower             Previously seen
                                          Extremities:
 LVOT:             Previously seen        Upper             Previously seen
                                          Extremities:

 Other:  Fetus appears to be a female. Heels previously visualized. Nasal
         bone previously visualized.
Cervix Uterus Adnexa

 Cervix:       Not visualized (advanced GA >20wks)
Impression

 SIUP at 31+1 weeks
 Normal interval anatomy; anatomic survey complete
 Normal amniotic fluid volume
 Appropriate interval growth with EFW at the 36th %tile
Recommendations

 Follow-up as clinically indicated
 No testing needed if remains diet controlled

 questions or concerns.

## 2016-02-12 ENCOUNTER — Ambulatory Visit: Payer: Self-pay | Admitting: Obstetrics

## 2016-03-05 ENCOUNTER — Encounter: Payer: Self-pay | Admitting: Obstetrics

## 2016-03-05 ENCOUNTER — Ambulatory Visit (INDEPENDENT_AMBULATORY_CARE_PROVIDER_SITE_OTHER): Payer: Medicaid Other | Admitting: Obstetrics

## 2016-03-05 VITALS — BP 98/73 | HR 72 | Wt 128.0 lb

## 2016-03-05 DIAGNOSIS — Z01419 Encounter for gynecological examination (general) (routine) without abnormal findings: Secondary | ICD-10-CM | POA: Diagnosis not present

## 2016-03-05 DIAGNOSIS — Z1389 Encounter for screening for other disorder: Secondary | ICD-10-CM | POA: Diagnosis not present

## 2016-03-05 DIAGNOSIS — Z0000 Encounter for general adult medical examination without abnormal findings: Secondary | ICD-10-CM

## 2016-03-05 LAB — POCT URINALYSIS DIPSTICK
Blood, UA: 250
Glucose, UA: NEGATIVE
Ketones, UA: NEGATIVE
LEUKOCYTES UA: NEGATIVE
Nitrite, UA: NEGATIVE
PH UA: 5
PROTEIN UA: NEGATIVE
Spec Grav, UA: 1.02
UROBILINOGEN UA: NEGATIVE

## 2016-03-07 LAB — PAP IG AND HPV HIGH-RISK
HPV, HIGH-RISK: NEGATIVE
PAP SMEAR COMMENT: 0

## 2016-03-07 LAB — NUSWAB VAGINITIS (VG)
CANDIDA ALBICANS, NAA: NEGATIVE
Candida glabrata, NAA: NEGATIVE
Trich vag by NAA: NEGATIVE

## 2016-03-08 ENCOUNTER — Encounter: Payer: Self-pay | Admitting: Obstetrics

## 2016-03-08 NOTE — Progress Notes (Signed)
Subjective:        Savannah Edwards is a 40 y.o. female here for a routine exam.  Current complaints: None.    Personal health questionnaire:  Is patient [name], have a family history of breast and/or ovarian cancer: no Is there a family history of uterine cancer diagnosed at age < 73, gastrointestinal cancer, urinary tract cancer, family member who is a Personnel officer syndrome-associated carrier: no Is the patient overweight and hypertensive, family history of diabetes, personal history of gestational diabetes, preeclampsia or PCOS: no Is patient over 46, have PCOS,  family history of premature CHD under age 41, diabetes, smoke, have hypertension or peripheral artery disease:  no At any time, has a partner hit, kicked or otherwise hurt or frightened you?: no Over the past 2 weeks, have you felt down, depressed or hopeless?: no Over the past 2 weeks, have you felt little interest or pleasure in doing things?:no   Gynecologic History No LMP recorded. Contraception: none Last Pap: 2016. Results were: normal Last mammogram: n/a Results were: n/a  Obstetric History OB History  Gravida Para Term Preterm AB SAB TAB Ectopic Multiple Living  # Outcome Date GA Lbr Len/2nd Weight Sex Delivery Anes PTL Lv  5 Gravida           4 Term 07/09/14 [redacted]w[redacted]d 16:06 / 04:44 6 lb 3.1 oz (2.809 kg) F Vag-Vacuum EPI  Y  3 SAB 11/05/11 [redacted]w[redacted]d         2 SAB 11/04/10 [redacted]w[redacted]d            Comments: System Generated. Please review and update pregnancy details.  1 SAB 11/04/02 [redacted]w[redacted]d             Past Medical History  Diagnosis Date  . No pertinent past medical history   . Gestational diabetes     diet controlled    Past Surgical History  Procedure Laterality Date  . Dilation and curettage of uterus       Current outpatient prescriptions:  .  Cholecalciferol (VITAMIN D-3) 1000 UNITS CAPS, Take 1 capsule by mouth daily. , Disp: , Rfl:  .  citalopram (CELEXA) 20 MG tablet, Take 1 tablet (20  mg total) by mouth daily before breakfast., Disp: 30 tablet, Rfl: 5 .  folic acid (FOLVITE) 400 MCG tablet, Take 400 mcg by mouth daily., Disp: , Rfl:  .  ibuprofen (ADVIL,MOTRIN) 600 MG tablet, TAKE 1 TABLET BY MOUTH EVERY 6 HOURS, Disp: 385 tablet, Rfl: 5 .  Prenat-Fe Poly-Methfol-FA-DHA (VITAFOL ULTRA) 29-0.6-0.4-200 MG CAPS, Take 1 capsule by mouth daily before breakfast., Disp: 90 capsule, Rfl: 3 No Known Allergies  Social History  Substance Use Topics  . Smoking status: Never Smoker   . Smokeless tobacco: Never Used  . Alcohol Use: No    Family History  Problem Relation Age of Onset  . Alcohol abuse Neg Hx   . Arthritis Neg Hx   . Asthma Neg Hx   . Birth defects Neg Hx   . COPD Neg Hx   . Depression Neg Hx   . Diabetes Neg Hx   . Drug abuse Neg Hx   . Early death Neg Hx   . Hearing loss Neg Hx   . Heart disease Neg Hx   . Hyperlipidemia Neg Hx   . Kidney disease Neg Hx   . Learning disabilities Neg Hx   . Mental illness Neg Hx   .  Mental retardation Neg Hx   . Miscarriages / Stillbirths Neg Hx   . Stroke Neg Hx   . Vision loss Neg Hx   . Other Neg Hx   . Hypertension Mother   . Cancer Paternal Uncle       Review of Systems  Constitutional: negative for fatigue and weight loss Respiratory: negative for cough and wheezing Cardiovascular: negative for chest pain, fatigue and palpitations Gastrointestinal: negative for abdominal pain and change in bowel habits Musculoskeletal:negative for myalgias Neurological: negative for gait problems and tremors Behavioral/Psych: negative for abusive relationship, depression Endocrine: negative for temperature intolerance   Genitourinary:negative for abnormal menstrual periods, genital lesions, hot flashes, sexual problems and vaginal discharge Integument/breast: negative for breast lump, breast tenderness, nipple discharge and skin lesion(s)    Objective:       BP 98/73 mmHg  Pulse 72  Wt 128 lb (58.06 kg) General:    alert  Skin:   no rash or abnormalities  Lungs:   clear to auscultation bilaterally  Heart:   regular rate and rhythm, S1, S2 normal, no murmur, click, rub or gallop  Breasts:   normal without suspicious masses, skin or nipple changes or axillary nodes  Abdomen:  normal findings: no organomegaly, soft, non-tender and no hernia  Pelvis:  External genitalia: normal general appearance Urinary system: urethral meatus normal and bladder without fullness, nontender Vaginal: normal without tenderness, induration or masses Cervix: normal appearance Adnexa: normal bimanual exam Uterus: anteverted and non-tender, normal size   Lab Review Urine pregnancy test Labs reviewed yes Radiologic studies reviewed no    Assessment:    Healthy female exam.    Preconception counseling   Plan:     PNV's Rx  Education reviewed: low fat, low cholesterol diet, self breast exams and weight bearing exercise. Follow up in: 1 year.   No orders of the defined types were placed in this encounter.   Orders Placed This Encounter  Procedures  . NuSwab Vaginitis (VG)  . POCT urinalysis dipstick

## 2018-10-23 ENCOUNTER — Encounter: Payer: Self-pay | Admitting: Obstetrics

## 2020-03-08 ENCOUNTER — Other Ambulatory Visit: Payer: Self-pay | Admitting: Internal Medicine

## 2020-03-08 DIAGNOSIS — Z1231 Encounter for screening mammogram for malignant neoplasm of breast: Secondary | ICD-10-CM

## 2020-03-14 ENCOUNTER — Ambulatory Visit
Admission: RE | Admit: 2020-03-14 | Discharge: 2020-03-14 | Disposition: A | Discharge status: Home / Self Care | Payer: Medicaid Other | Source: Ambulatory Visit | Attending: Internal Medicine | Admitting: Internal Medicine

## 2020-03-14 ENCOUNTER — Other Ambulatory Visit: Payer: Self-pay

## 2020-03-14 DIAGNOSIS — Z1231 Encounter for screening mammogram for malignant neoplasm of breast: Secondary | ICD-10-CM

## 2020-12-13 ENCOUNTER — Other Ambulatory Visit: Payer: Self-pay | Admitting: Nurse Practitioner

## 2020-12-13 DIAGNOSIS — Z1231 Encounter for screening mammogram for malignant neoplasm of breast: Secondary | ICD-10-CM

## 2021-03-15 ENCOUNTER — Ambulatory Visit
Admission: RE | Admit: 2021-03-15 | Discharge: 2021-03-15 | Disposition: A | Discharge status: Home / Self Care | Payer: Medicaid Other | Source: Ambulatory Visit | Attending: Nurse Practitioner | Admitting: Nurse Practitioner

## 2021-03-15 ENCOUNTER — Other Ambulatory Visit: Payer: Self-pay

## 2021-03-15 DIAGNOSIS — Z1231 Encounter for screening mammogram for malignant neoplasm of breast: Secondary | ICD-10-CM

## 2021-04-09 ENCOUNTER — Other Ambulatory Visit: Payer: Self-pay | Admitting: Orthopedic Surgery

## 2022-01-10 ENCOUNTER — Other Ambulatory Visit: Payer: Self-pay | Admitting: Nurse Practitioner

## 2022-01-10 DIAGNOSIS — Z1231 Encounter for screening mammogram for malignant neoplasm of breast: Secondary | ICD-10-CM

## 2022-03-18 ENCOUNTER — Ambulatory Visit
Admission: RE | Admit: 2022-03-18 | Discharge: 2022-03-18 | Disposition: A | Discharge status: Home / Self Care | Payer: Medicaid Other | Source: Ambulatory Visit | Attending: Nurse Practitioner | Admitting: Nurse Practitioner

## 2022-03-18 DIAGNOSIS — Z1231 Encounter for screening mammogram for malignant neoplasm of breast: Secondary | ICD-10-CM

## 2022-03-19 ENCOUNTER — Other Ambulatory Visit: Payer: Self-pay | Admitting: Nurse Practitioner

## 2022-03-19 DIAGNOSIS — R928 Other abnormal and inconclusive findings on diagnostic imaging of breast: Secondary | ICD-10-CM

## 2022-03-22 ENCOUNTER — Other Ambulatory Visit: Payer: Self-pay | Admitting: Nurse Practitioner

## 2022-03-22 ENCOUNTER — Other Ambulatory Visit: Payer: Self-pay | Admitting: Family Medicine

## 2022-03-22 DIAGNOSIS — R928 Other abnormal and inconclusive findings on diagnostic imaging of breast: Secondary | ICD-10-CM

## 2022-03-23 ENCOUNTER — Ambulatory Visit
Admission: RE | Admit: 2022-03-23 | Discharge: 2022-03-23 | Disposition: A | Discharge status: Home / Self Care | Payer: Medicaid Other | Source: Ambulatory Visit | Attending: Nurse Practitioner | Admitting: Nurse Practitioner

## 2022-03-23 ENCOUNTER — Ambulatory Visit: Payer: Medicaid Other

## 2022-03-23 ENCOUNTER — Other Ambulatory Visit: Payer: Self-pay | Admitting: Nurse Practitioner

## 2022-03-23 DIAGNOSIS — R928 Other abnormal and inconclusive findings on diagnostic imaging of breast: Secondary | ICD-10-CM

## 2022-03-23 DIAGNOSIS — R921 Mammographic calcification found on diagnostic imaging of breast: Secondary | ICD-10-CM

## 2022-03-27 ENCOUNTER — Ambulatory Visit
Admission: RE | Admit: 2022-03-27 | Discharge: 2022-03-27 | Disposition: A | Discharge status: Home / Self Care | Payer: Medicaid Other | Source: Ambulatory Visit | Attending: Nurse Practitioner | Admitting: Nurse Practitioner

## 2022-03-27 DIAGNOSIS — R921 Mammographic calcification found on diagnostic imaging of breast: Secondary | ICD-10-CM

## 2022-10-07 NOTE — Progress Notes (Unsigned)
Cardiology Office Note:    Date:  10/08/2022   ID:  Savannah Edwards, DOB 1976-10-15, MRN [id number]  PCP:  Brock Bad, MD   [facility] Providers Cardiologist:  Allante Beane   Referring MD: Donato Schultz, FNP   Chief Complaint  Patient presents with   Chest Pain    History of Present Illness:    Savannah Edwards is a 46 y.o. female with a hx of DM, chest pain .  Originally from [place]   Has occasional episodes of CP Lasts for a few seconds,  typically early in the morning  Occasionally at work ,  Difficult to breath for a second or so.  Occurs 1-2 times a month   No regular exercise    family hx of CAD :  cousin passed away with MI at age 34 Labs from her medical doctor's office reveals complete metabolic profile that is within normal limits.  Glucose level is 93.  Creatinine is 0.55.  Liver enzymes are normal.  Lipid profile Total cholesterol is 150 Triglyceride level is 88 HDL is 53 LDL is 80  Hemoglobin A1c is 5.6  Vitamin D level is 20.8  CBC is within normal limits.  Hemoglobin is 11.9  TSH is 0.415.  Free T4 is 1.46.  Will recheck TSH in 3 months   Works at [employer] Education officer, community )        Past Medical History:  Diagnosis Date   Chest pain    Gestational diabetes    diet controlled   No pertinent past medical history     Past Surgical History:  Procedure Laterality Date   DILATION AND CURETTAGE OF UTERUS      Current Medications: Current Meds  Medication Sig   Cholecalciferol (VITAMIN D-3) 1000 UNITS CAPS Take 1 capsule by mouth daily.      Allergies:   Patient has no known allergies.   Social History   Socioeconomic History   Marital status: Married    Spouse name: Not on file   Number of children: Not on file   Years of education: Not on file   Highest education level: Not on file  Occupational History   Not on file  Tobacco Use   Smoking status: Never   Smokeless tobacco: Never  Vaping Use   Vaping  Use: Never used  Substance and Sexual Activity   Alcohol use: Yes    Alcohol/week: 2.0 standard drinks of alcohol    Types: 2 Cans of beer per week   Drug use: No   Sexual activity: Yes    Birth control/protection: Condom  Other Topics Concern   Not on file  Social History Narrative   Not on file   Social Determinants of Health   Financial Resource Strain: Not on file  Food Insecurity: Not on file  Transportation Needs: Not on file  Physical Activity: Not on file  Stress: Not on file  Social Connections: Not on file     Family History: The patient's family history includes Cancer in her paternal uncle; Hypertension in her mother. There is no history of Alcohol abuse, Arthritis, Asthma, Birth defects, COPD, Depression, Diabetes, Drug abuse, Early death, Hearing loss, Heart disease, Hyperlipidemia, Kidney disease, Learning disabilities, Mental illness, Mental retardation, Miscarriages / Stillbirths, Stroke, Vision loss, Other, or Breast cancer.  ROS:   Please see the history of present illness.     All other systems reviewed and are negative.  EKGs/Labs/Other Studies Reviewed:  The following studies were reviewed today:   EKG: October 08, 2022: Normal sinus rhythm at 68.  No ST or T wave changes. Recent Labs: No results found for requested labs within last 365 days.  Recent Lipid Panel No results found for: "CHOL", "TRIG", "HDL", "CHOLHDL", "VLDL", "LDLCALC", "LDLDIRECT"   Risk Assessment/Calculations:                Physical Exam:    VS:  BP (!) 92/58   Pulse 68   Ht 5\' 1"  (1.549 m)   Wt 117 lb (53.1 kg)   SpO2 99%   BMI 22.11 kg/m     Wt Readings from Last 3 Encounters:  10/08/22 117 lb (53.1 kg)  03/05/16 128 lb (58.1 kg)  11/28/15 128 lb (58.1 kg)     GEN:  Well nourished, well developed in no acute distress HEENT: Normal NECK: No JVD; No carotid bruits LYMPHATICS: No lymphadenopathy CARDIAC: RRR, no murmurs, rubs, gallops RESPIRATORY:  Clear  to auscultation without rales, wheezing or rhonchi  ABDOMEN: Soft, non-tender, non-distended MUSCULOSKELETAL:  No edema; No deformity  SKIN: Warm and dry NEUROLOGIC:  Alert and oriented x 3 PSYCHIATRIC:  Normal affect   ASSESSMENT:    1. Non-cardiac chest pain   2. Low TSH level    PLAN:       Chest pain :   sounds like non cardiac CP.  Lasts for only a seconds .   Not exertional   I would like her to start exercising on a regular basis.  I encouraged her to start taking vitamin D supplements.  Will see if she feels better with some regular exercise and stress reduction.  2.  Low TSH: TSH was borderline low.  Will keep an eye on this.  Her free T4 was normal so it does not appear that she is hyperthyroid at this time but it warrants watching.                 Medication Adjustments/Labs and Tests Ordered: Current medicines are reviewed at length with the patient today.  Concerns regarding medicines are outlined above.  Orders Placed This Encounter  Procedures   TSH   EKG 12-Lead   No orders of the defined types were placed in this encounter.   Patient Instructions  Medication Instructions:  Your physician recommends that you continue on your current medications as directed. Please refer to the Current Medication list given to you today.  **PLEASE start taking Vitamin D supplement** *If you need a refill on your cardiac medications before your next appointment, please call your pharmacy*   Lab Work: NONE If you have labs (blood work) drawn today and your tests are completely normal, you will receive your results only by: MyChart Message (if you have MyChart) OR A paper copy in the mail If you have any lab test that is abnormal or we need to change your treatment, we will call you to review the results.   Testing/Procedures: NONE   Follow-Up: At [facility], you and your health needs are our priority.  As part of our continuing mission to  provide you with exceptional heart care, we have created designated Provider Care Teams.  These Care Teams include your primary Cardiologist (physician) and Advanced Practice Providers (APPs -  Physician Assistants and Nurse Practitioners) who all work together to provide you with the care you need, when you need it.  We recommend signing up for the patient portal called "MyChart".  Sign  up information is provided on this After Visit Summary.  MyChart is used to connect with patients for Virtual Visits (Telemedicine).  Patients are able to view lab/test results, encounter notes, upcoming appointments, etc.  Non-urgent messages can be sent to your provider as well.   To learn more about what you can do with MyChart, go to [email or web address].    Your next appointment:   3 month(s)  The format for your next appointment:   In Person  Provider:   Mertie Moores, MD    Important Information About Sugar         Signed, Mertie Moores, MD  10/08/2022 9:58 AM    [place]

## 2022-10-08 ENCOUNTER — Encounter: Payer: Self-pay | Admitting: Cardiovascular Disease

## 2022-10-08 ENCOUNTER — Ambulatory Visit: Payer: Medicaid Other | Attending: Cardiovascular Disease | Admitting: Cardiovascular Disease

## 2022-10-08 VITALS — BP 92/58 | HR 68 | Ht 61.0 in | Wt 117.0 lb

## 2022-10-08 DIAGNOSIS — R0789 Other chest pain: Secondary | ICD-10-CM | POA: Diagnosis not present

## 2022-10-08 DIAGNOSIS — R7989 Other specified abnormal findings of blood chemistry: Secondary | ICD-10-CM | POA: Diagnosis not present

## 2022-10-08 NOTE — Patient Instructions (Signed)
Medication Instructions:  Your physician recommends that you continue on your current medications as directed. Please refer to the Current Medication list given to you today.  **PLEASE start taking Vitamin D supplement** *If you need a refill on your cardiac medications before your next appointment, please call your pharmacy*   Lab Work: NONE If you have labs (blood work) drawn today and your tests are completely normal, you will receive your results only by: MyChart Message (if you have MyChart) OR A paper copy in the mail If you have any lab test that is abnormal or we need to change your treatment, we will call you to review the results.   Testing/Procedures: NONE   Follow-Up: At [facility], you and your health needs are our priority.  As part of our continuing mission to provide you with exceptional heart care, we have created designated Provider Care Teams.  These Care Teams include your primary Cardiologist (physician) and Advanced Practice Providers (APPs -  Physician Assistants and Nurse Practitioners) who all work together to provide you with the care you need, when you need it.  We recommend signing up for the patient portal called "MyChart".  Sign up information is provided on this After Visit Summary.  MyChart is used to connect with patients for Virtual Visits (Telemedicine).  Patients are able to view lab/test results, encounter notes, upcoming appointments, etc.  Non-urgent messages can be sent to your provider as well.   To learn more about what you can do with MyChart, go to [email or web address].    Your next appointment:   3 month(s)  The format for your next appointment:   In Person  Provider:   Kristeen Miss, MD    Important Information About Sugar

## 2023-01-06 ENCOUNTER — Encounter: Payer: Self-pay | Admitting: Cardiovascular Disease

## 2023-01-06 NOTE — Progress Notes (Unsigned)
Cardiology Office Note:    Date:  01/07/2023   ID:  Savannah Edwards, DOB [birth year], MRN [id number]  PCP:  Shelly Bombard, MD   [facility] Providers Cardiologist:  Carolie Mcilrath   Referring MD: Shelly Bombard, MD   Chief Complaint  Patient presents with   Chest Pain    History of Present Illness:    Savannah Edwards is a 47 y.o. female with a hx of DM, chest pain .  Originally from [place]   Has occasional episodes of CP Lasts for a few seconds,  typically early in the morning  Occasionally at work ,  Difficult to breath for a second or so.  Occurs 1-2 times a month   No regular exercise    family hx of CAD :  cousin passed away with MI at age 4 Labs from her medical doctor's office reveals complete metabolic profile that is within normal limits.  Glucose level is 93.  Creatinine is 0.55.  Liver enzymes are normal.  Lipid profile Total cholesterol is 150 Triglyceride level is 88 HDL is 53 LDL is 80  Hemoglobin A1c is 5.6  Vitamin D level is 20.8  CBC is within normal limits.  Hemoglobin is 11.9  TSH is 0.415.  Free T4 is 1.46.  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    January 07, 2023 Savannah Edwards is seen for follow up of her atypical CP Low Vit D levels  Still having rare episodes of atypical CP Last for perhaps a second or so .   Is not exercising  We discussed getting a coronary calcium score  She has pre-diabetes         Past Medical History:  Diagnosis Date   Chest pain    Gestational diabetes    diet controlled   No pertinent past medical history     Past Surgical History:  Procedure Laterality Date   DILATION AND CURETTAGE OF UTERUS      Current Medications: Current Meds  Medication Sig   Cholecalciferol (VITAMIN D-3) 1000 UNITS CAPS Take 1 capsule by mouth daily.    Omega-3 Fatty Acids (FISH OIL PO) Take 1 capsule by mouth daily in the afternoon.     Allergies:   Patient has no  known allergies.   Social History   Socioeconomic History   Marital status: Married    Spouse name: Not on file   Number of children: Not on file   Years of education: Not on file   Highest education level: Not on file  Occupational History   Not on file  Tobacco Use   Smoking status: Never   Smokeless tobacco: Never  Vaping Use   Vaping Use: Never used  Substance and Sexual Activity   Alcohol use: Yes    Alcohol/week: 2.0 standard drinks of alcohol    Types: 2 Cans of beer per week   Drug use: No   Sexual activity: Yes    Birth control/protection: Condom  Other Topics Concern   Not on file  Social History Narrative   Not on file   Social Determinants of Health   Financial Resource Strain: Not on file  Food Insecurity: Not on file  Transportation Needs: Not on file  Physical Activity: Not on file  Stress: Not on file  Social Connections: Not on file     Family History: The patient's family history includes Cancer in her paternal  uncle; Hypertension in her mother. There is no history of Alcohol abuse, Arthritis, Asthma, Birth defects, COPD, Depression, Diabetes, Drug abuse, Early death, Hearing loss, Heart disease, Hyperlipidemia, Kidney disease, Learning disabilities, Mental illness, Mental retardation, Miscarriages / Stillbirths, Stroke, Vision loss, Other, or Breast cancer.  ROS:   Please see the history of present illness.     All other systems reviewed and are negative.  EKGs/Labs/Other Studies Reviewed:    The following studies were reviewed today:   EKG: October 08, 2022: Normal sinus rhythm at 68.  No ST or T wave changes. Recent Labs: No results found for requested labs within last 365 days.  Recent Lipid Panel No results found for: "CHOL", "TRIG", "HDL", "CHOLHDL", "VLDL", "LDLCALC", "LDLDIRECT"   Risk Assessment/Calculations:        Physical Exam:    Physical Exam: Blood pressure 110/68, pulse 79, height '5\' 1"'$  (1.549 m), weight 125 lb 3.2 oz  (56.8 kg), SpO2 99 %, unknown if currently breastfeeding.       GEN:  Well nourished, well developed in no acute distress HEENT: Normal NECK: No JVD; No carotid bruits LYMPHATICS: No lymphadenopathy CARDIAC: RRR  , no murmurs  RESPIRATORY:  Clear to auscultation without rales, wheezing or rhonchi  ABDOMEN: Soft, non-tender, non-distended MUSCULOSKELETAL:  No edema; No deformity  SKIN: Warm and dry NEUROLOGIC:  Alert and oriented x 3   ASSESSMENT:    1. Atypical chest pain     PLAN:       Chest pain :    She still has occasional episodes of this atypical chest pain.  The episodes only last for few seconds.  They are not consistent with acute coronary syndrome.  I encouraged her to start exercising and try to pay attention whether or not these episodes of chest pain are related to any activity or perhaps eating or drinking.    We discussed getting a coronary calcium score.  She wants to hold off for now. Will plan on seeing her again in 1 year.  2.  Low TSH: TSH was borderline low.  Will keep an eye on this.   3.  Low Vit D level. She is on vit D 1000 iu a day , encouraged her to increase to 2000 a day                 Medication Adjustments/Labs and Tests Ordered: Current medicines are reviewed at length with the patient today.  Concerns regarding medicines are outlined above.  No orders of the defined types were placed in this encounter.  No orders of the defined types were placed in this encounter.   Patient Instructions  Medication Instructions:  Your physician recommends that you continue on your current medications as directed. Please refer to the Current Medication list given to you today.  *If you need a refill on your cardiac medications before your next appointment, please call your pharmacy*   Lab Work: None If you have labs (blood work) drawn today and your tests are completely normal, you will receive your results only by: [place] (if  you have MyChart) OR A paper copy in the mail If you have any lab test that is abnormal or we need to change your treatment, we will call you to review the results.   Testing/Procedures: None   Follow-Up: At [facility], you and your health needs are our priority.  As part of our continuing mission to provide you with exceptional heart care, we have created  designated Provider Care Teams.  These Care Teams include your primary Cardiologist (physician) and Advanced Practice Providers (APPs -  Physician Assistants and Nurse Practitioners) who all work together to provide you with the care you need, when you need it.   Your next appointment:   1 year(s)  Provider:   Mertie Moores, MD     Signed, Mertie Moores, MD  01/07/2023 10:08 AM    [place]

## 2023-01-07 ENCOUNTER — Encounter: Payer: Self-pay | Admitting: Cardiovascular Disease

## 2023-01-07 ENCOUNTER — Ambulatory Visit: Payer: Medicaid Other | Attending: Cardiovascular Disease | Admitting: Cardiovascular Disease

## 2023-01-07 VITALS — BP 110/68 | HR 79 | Ht 61.0 in | Wt 125.2 lb

## 2023-01-07 DIAGNOSIS — R0789 Other chest pain: Secondary | ICD-10-CM

## 2023-01-07 NOTE — Patient Instructions (Signed)
Medication Instructions:  Your physician recommends that you continue on your current medications as directed. Please refer to the Current Medication list given to you today.  *If you need a refill on your cardiac medications before your next appointment, please call your pharmacy*   Lab Work: None If you have labs (blood work) drawn today and your tests are completely normal, you will receive your results only by: [place] (if you have MyChart) OR A paper copy in the mail If you have any lab test that is abnormal or we need to change your treatment, we will call you to review the results.   Testing/Procedures: None   Follow-Up: At [facility], you and your health needs are our priority.  As part of our continuing mission to provide you with exceptional heart care, we have created designated Provider Care Teams.  These Care Teams include your primary Cardiologist (physician) and Advanced Practice Providers (APPs -  Physician Assistants and Nurse Practitioners) who all work together to provide you with the care you need, when you need it.   Your next appointment:   1 year(s)  Provider:   Mertie Moores, MD

## 2023-01-28 ENCOUNTER — Telehealth: Payer: Medicaid Other

## 2023-01-29 ENCOUNTER — Encounter: Payer: Medicaid Other | Admitting: Family Medicine

## 2023-02-04 ENCOUNTER — Encounter: Payer: Self-pay | Admitting: Obstetrics and Gynecology

## 2023-03-07 ENCOUNTER — Other Ambulatory Visit: Payer: Self-pay | Admitting: Family

## 2023-03-07 DIAGNOSIS — Z1231 Encounter for screening mammogram for malignant neoplasm of breast: Secondary | ICD-10-CM

## 2023-04-09 ENCOUNTER — Ambulatory Visit
Admission: RE | Admit: 2023-04-09 | Discharge: 2023-04-09 | Disposition: A | Discharge status: Home / Self Care | Payer: Medicaid Other | Source: Ambulatory Visit | Attending: Family | Admitting: Family

## 2023-04-09 DIAGNOSIS — Z1231 Encounter for screening mammogram for malignant neoplasm of breast: Secondary | ICD-10-CM

## 2024-03-04 LAB — RESULTS CONSOLE HPV: CHL HPV: NEGATIVE

## 2024-03-04 LAB — HM PAP SMEAR

## 2024-03-04 NOTE — Progress Notes (Signed)
 Subjective  HPI Savannah Edwards is a 48 year old female here today for Complete Physical Exam Sav[facility] is here for physical exam. Colonoscopy: Never; Mammogram: Done 04/09/2023;PAP: Due. Immunizations: HEP B recommended. She does not see a GYN. Her last menstrual duration was for 10-12 days with heavy bleeding/clots and gradually lighten. )  She was last seen in this office on 12/18/2022 by this provider. At that time, she was seen for (+) pregnancy test and was referred to GYN. She states that she later had a miscarriage.  She is here today alone and is originally from [place]. She moved to [place] in her teens and graduated from [employer] with a Bachelor's degree in Primary school teacher. She works part-time at her [employer] working at least 5-6 hours/day at [employer]. She is married and lives at home with her husband and 8-year-old daughter.   Diet - 2-3 meals/day; occasional snacks  Water - 3 bottles/day Juice/Soda - none Tea / Coffee - 1 cup/day of each Exercise - Walking daily x 20-30 mins/day Tobacco - Never Smoker Alcohol - occasionally Drugs - Denies Sexually active - Yes, with 1 female Intimate partner violence - denies   Anxiety/depression - I'm doing good  Pap - 10/2019; (-) HPV  Mammogram - 04/09/23 (normal)  Colonoscopy - never; NEEDS Birth Control - condoms Last eye exam - uncertain; NEEDS, has issues with  Last dental exam - x March 2025; has braces LMP - 02/01/24; occurs monthly and lasts x 5-7 days. However, this period lasted x 10-12 days had thumb-sized clots. Reports always having clots.  Patient Active Problem List   Diagnosis Date Noted  . Vitamin D  deficiency   . Miscarriage within last 12 months   . Allergic Rhinitis 01/03/2022  . Abnormal uterine bleeding 04/03/2021  . Prediabetes 12/12/2020  . Low TSH level 12/12/2020    Past Medical History:  Diagnosis Date  . Vitamin D  Deficiency     No past surgical history on file.  Tobacco History    Tobacco Use  Smoking Status Never  . Passive exposure: Never  Smokeless Tobacco Never   Social History   Substance and Sexual Activity  Alcohol Use Not Currently   Comment: occasionally   Social History   Substance and Sexual Activity  Drug Use Never   Social History   Substance and Sexual Activity  Sexual Activity Yes  . Partners: Male     03/04/2024  9:50 AM  How do you learn best? Listening  On average, how many days per week do you engage in moderate to strenuous exercise? (0-7) 6  On average, how many minutes do you engage in exercise at this level? 60  Weekly Physical Activity 360  How often does anyone, including family and friends, physically hurt you? Never  How often does anyone, including family and friends, insult or talk down to you? Never  How often does anyone, including family and friends, threaten you with harm? Never  How often does anyone, including family and friends, scream or curse at you? Never  Relationship Safety Total Score: >11 is abnormal 4  Are you currently employed? Yes  What is your current work situation? Full time work  Are you having any problems with your boss? No    Family History  Problem Relation Name Age of Onset  . Hypertension Mother    . High Cholesterol Mother    . Diabetes Mother    . No Known Problems Father  Review of Systems  Constitutional:  Negative for chills, diaphoresis, fatigue and fever.  HENT:  Negative for congestion, rhinorrhea, sinus pain, sneezing and sore throat.   Eyes:  Negative for pain and visual disturbance.  Respiratory:  Negative for cough and shortness of breath.   Breasts: Negative for pain.  Gastrointestinal:  Negative for abdominal pain, constipation, nausea and vomiting.  Genitourinary:  Positive for menstrual problem. Negative for difficulty urinating, dysuria, frequency and vaginal discharge.  Neurological:  Negative for dizziness and headaches.  Psychiatric/Behavioral:  Negative  for hallucinations and self-injury.     Objective   Vitals:   03/04/24 0938  BP: 112/80  Pulse: 72  Temp: 97.4 F (36.3 C)  TempSrc: Oral  SpO2: 99%  Weight: 126 lb 9.6 oz (57.4 kg)  Height: 4' 11.84 (1.52 m)   Estimated body mass index is 24.85 kg/m as calculated from the following:   Height as of this encounter: 4' 11.84 (1.52 m).   Weight as of this encounter: 126 lb 9.6 oz (57.4 kg). Facility age limit for growth %iles is 20 years.   Physical Exam Vitals and nursing note reviewed. Exam conducted with a chaperone present CONI Seip, CMA).   Constitutional:      General: [facility] is not in acute distress.    Appearance: Normal appearance. [employer] is well-developed and normal weight. [facility] is not ill-appearing, toxic-appearing or diaphoretic.  HENT:     Head: Normocephalic and atraumatic. Not macrocephalic.     Right Ear: Tympanic membrane, ear canal and external ear normal.     Left Ear: Tympanic membrane, ear canal and external ear normal.     Nose: Nose normal.     Mouth/Throat:     Mouth: Mucous membranes are moist.     Pharynx: Oropharynx is clear. Uvula midline. No oropharyngeal exudate.  Eyes:     General: Lids are normal.     Conjunctiva/sclera: Conjunctivae normal.     Pupils: Pupils are equal, round, and reactive to light.  Neck:     Thyroid: No thyroid mass or thyromegaly.     Trachea: Trachea normal. No tracheal deviation.   Cardiovascular:     Rate and Rhythm: Normal rate and regular rhythm.     Chest Wall: PMI is not displaced.     Pulses: Normal pulses.     Heart sounds: Normal heart sounds. No murmur heard. Pulmonary:     Effort: Pulmonary effort is normal. No accessory muscle usage or respiratory distress.     Breath sounds: Normal breath sounds. No decreased breath sounds or wheezing.  Chest:     Chest wall: No mass, lacerations or tenderness.     Breasts:        Right: Normal. No swelling, inverted nipple, mass, nipple discharge, skin  change or tenderness.  Lymph Nodes (Right): Breast Lymph Node Right Normal       Left: Normal. No swelling, inverted nipple, mass, nipple discharge, skin change or tenderness.  Lymph Nodes (Left): normal left breast lymph node.  Inspection: bilateral normal Palpation: bilateral normalAbdominal:     General: Bowel sounds are normal.     Palpations: Abdomen is soft. There is no hepatomegaly or splenomegaly.     Tenderness: There is no abdominal tenderness.     Hernia: There is no hernia in the left inguinal area or right inguinal area.  Genitourinary:    Chaperone Present CONI Seip, CMA).    Genitalia: external female genitalia normal.  Labia Minora: Normal.         Labia Majora: Normal.          Surrounding Skin: Normal.         Urethra: Normal.     Vagina: Normal.     Cervix: Normal.     Uterus: Normal.      Adnexa: Right adnexa normal and left adnexa normal.         Right: No tenderness or enlarged.           Left: No tenderness or enlarged.           Rectum: Normal.   Musculoskeletal:        General: No tenderness or signs of injury. Normal range of motion.     Cervical back: Full passive range of motion without pain and neck supple. No edema. Normal range of motion.     Right lower leg: No edema.     Left lower leg: No edema.  Lymphadenopathy:     Cervical: No cervical adenopathy.     Lower Body: No right inguinal adenopathy. No left inguinal adenopathy.  Skin:    General: Skin is warm and dry.     Coloration: Skin is not pale.     Findings: No abrasion.  Neurological:     General: No focal deficit present.     Mental Status: [facility] [employer] is alert and oriented to person, place, and time.     Motor: No tremor, atrophy, abnormal muscle tone or seizure activity.     Gait: Gait normal.  Psychiatric:        Mood and Affect: Mood normal.        Speech: Speech normal.        Behavior: Behavior normal. Behavior is cooperative.        Thought Content: Thought content  normal.        Judgment: Judgment normal.   Assessment and Plan  Discussed with the pt healthy diet: balanced with fruits and vegetables, limit sweets/salty foods/fried and greasy foods. Discussed exercise: minimum 120 min/week of moderate exercise. Pt is sexually active. They denies intimate partner violence. They have not been screened for chlamydia gonorrhea hepatitis HIV syphilis trichomonas and will be screened today. Pt denies anxiety/depression.  1. Encounter for annual physical exam (Primary) - FE+CBC/D/PLT/TIBC/FER/RETIC - COMPREHENSIVE METABOLIC PANEL - HEMOGLOBIN GLYCOSYLATED A1C - TSH + FREE T4 - LIPID PANEL - HIV 1/0/2 AG/AB W/CASCADE RFLX SUPPLEMENTAL TESTING - 25 HYDROXY INCLUDES FRACTIONS IF PERFORMED - RPR (MONITOR) W/REFL TITER - HEP C RNA QT, RT PCR W/RFLX GENO LIPA - REFERRAL TO OPTOMETRY  2. Encounter for screening for malignant neoplasm of colon - REFERRAL TO GASTROENTEROLOGY  3. Vaginal discharge - VAGINITIS PLUS, NUSWAB  4. Encounter for screening for malignant neoplasm of cervix - PAP, LIQ-BASED, IMAGE GUIDED + HPV W/RFLX GENO 16/18 + 45  5. Need for hepatitis B screening test - HEPATITIS B CORE ANTIBODY HBCAB TOTAL - HEPATITIS B SURFACE AB QUANTITATIVE (CPT [phone])  6. Menorrhagia with regular cycle - US  PELVIC NONOBSTETRIC REAL-TIME IMAGE COMPLETE  7. Encounter for screening for diseases of the blood and blood-forming organs and certain disorders involving the immune mechanism - FE+CBC/D/PLT/TIBC/FER/RETIC - COMPREHENSIVE METABOLIC PANEL  8. Encounter for screening for diabetes mellitus - HEMOGLOBIN GLYCOSYLATED A1C  9. Screening for thyroid disorder - TSH + FREE T4  10. Encounter for screening for lipoid disorders - LIPID PANEL  11. Screening examination for sexually transmitted disease - HIV 1/0/2 AG/AB W/CASCADE  RFLX SUPPLEMENTAL TESTING - RPR (MONITOR) W/REFL TITER  12. Vitamin D  deficiency, unspecified - 25 HYDROXY INCLUDES FRACTIONS  IF PERFORMED  13. Encounter for HCV screening test for low risk patient - HEP C RNA QT, RT PCR W/RFLX GENO LIPA   Annabella Rigg, NP Family Nurse Practitioner [facility]

## 2024-03-09 ENCOUNTER — Other Ambulatory Visit: Payer: Self-pay | Admitting: Family

## 2024-03-09 DIAGNOSIS — Z1231 Encounter for screening mammogram for malignant neoplasm of breast: Secondary | ICD-10-CM

## 2024-03-10 ENCOUNTER — Other Ambulatory Visit: Payer: Self-pay | Admitting: Family

## 2024-03-10 DIAGNOSIS — N920 Excessive and frequent menstruation with regular cycle: Secondary | ICD-10-CM

## 2024-03-11 ENCOUNTER — Ambulatory Visit
Admission: RE | Admit: 2024-03-11 | Discharge: 2024-03-11 | Disposition: A | Discharge status: Home / Self Care | Source: Ambulatory Visit | Attending: Family | Admitting: Family

## 2024-03-11 DIAGNOSIS — N920 Excessive and frequent menstruation with regular cycle: Secondary | ICD-10-CM

## 2024-03-23 NOTE — Result Encounter Note (Signed)
 [facility] Virtual Nurse 03-23-2024 @ 0935-Called and informed pt of u/s results and referral to a GYN provider. Pt verbalized understanding and agreement. Nurse provided pt with referral name and contact info, per pt's request.

## 2024-04-09 ENCOUNTER — Ambulatory Visit
Admission: RE | Admit: 2024-04-09 | Discharge: 2024-04-09 | Disposition: A | Discharge status: Home / Self Care | Source: Ambulatory Visit | Attending: Family | Admitting: Family

## 2024-04-09 DIAGNOSIS — Z1231 Encounter for screening mammogram for malignant neoplasm of breast: Secondary | ICD-10-CM

## 2024-06-29 NOTE — Telephone Encounter (Signed)
 Spoke to pt regarding lab results and provider recommendations.   Pt verbalized understanding.  No further action needed at this time.

## 2024-07-13 ENCOUNTER — Other Ambulatory Visit: Payer: Self-pay

## 2024-07-14 ENCOUNTER — Encounter: Payer: Self-pay | Admitting: Obstetrics and Gynecology

## 2024-07-14 ENCOUNTER — Ambulatory Visit: Admitting: Obstetrics and Gynecology

## 2024-07-14 VITALS — BP 113/78 | HR 67 | Ht 61.0 in | Wt 133.0 lb

## 2024-07-14 DIAGNOSIS — N920 Excessive and frequent menstruation with regular cycle: Secondary | ICD-10-CM

## 2024-07-14 DIAGNOSIS — Z1331 Encounter for screening for depression: Secondary | ICD-10-CM | POA: Diagnosis not present

## 2024-07-14 NOTE — Progress Notes (Signed)
 48 y.o. New GYN presents for Menorrhagia, Pt changes overnight pads every 2 hours.   Last PAP  03/04/2024 Last Mammogram  04/14/24

## 2024-07-14 NOTE — Progress Notes (Signed)
 NEW GYNECOLOGY VISIT Chief Complaint  Patient presents with   NEW PATIENT/GYN     Subjective:  [facility] is a 48 y.o. [phone] who presents for concern about her periods   She reports that over the past few years, her periods have been a bit heavier, especially the first 3 days. Reports that she asked her friends about this but they didn't have the same experience. She reports regular periods, monthly, bleeds for 5-7 days but days 4-7 are usually very light. She had one episode a several months ago where she bled for > 1 week and was worried she could be pregnant but was not. She denies intermenstrual bleeding.  Pelvic ultrasound 03/11/2024: FINDINGS: Uterus   Measurements: 7 point 8 x 4.5 x 5.5 cm = volume: 99.6 mL. No fibroids or other mass visualized. Small sub endometrial cyst at the fundus measuring 6 mm.   Endometrium   Thickness: 13 mm.  No focal abnormality visualized.   Right ovary   Measurements: 2.7 x 1.7 x 2.1 cm = volume: 5.0 mL. Normal appearance/no adnexal mass.   Left ovary   Measurements: 3.1 x 2 x 3 cm = volume: 8.1 mL. Normal appearance/no adnexal mass.   Other findings   Small volume free fluid in the pelvis.   IMPRESSION: Negative pelvic ultrasound. Endometrial thickness within normal limits for premenopausal patient.  03/04/24 Labs - Hgb 12.3 - TSH 1.39  Gyn History: Patient's last menstrual period was 07/13/2024 (exact date). Sexually active: yes/no: Yes Contraception: condoms History of STIs: No Last pap: NILM/HPV neg 03/04/2024 History of abnormal pap: No Periods: as above  OB History     Gravida  5   Para  1   Term  1   Preterm      AB  3   Living  1      SAB  3   IAB      Ectopic      Multiple      Live Births  1           Past Medical History:  Diagnosis Date   Chest pain    Gestational diabetes    diet controlled   No pertinent past medical history     Past Surgical History:  Procedure  Laterality Date   DILATION AND CURETTAGE OF UTERUS      Social History   Socioeconomic History   Marital status: Married    Spouse name: Not on file   Number of children: 1   Years of education: Not on file   Highest education level: Not on file  Occupational History   Not on file  Tobacco Use   Smoking status: Never   Smokeless tobacco: Never  Vaping Use   Vaping status: Never Used  Substance and Sexual Activity   Alcohol use: Yes    Alcohol/week: 2.0 standard drinks of alcohol    Types: 2 Cans of beer per week   Drug use: No   Sexual activity: Yes    Birth control/protection: Condom  Other Topics Concern   Not on file  Social History Narrative   Not on file   Social Drivers of Health   Financial Resource Strain: Not on File (02/21/2022)   Received from [facility]    Financial Resource Strain: 0  Food Insecurity: Not at Risk (03/04/2024)   Received from [facility] Insecurity    Within the past 12 months, you worried  that your food would run out before you got money to buy more.: 1  Transportation Needs: Not at Risk (03/04/2024)   Received from [facility] Needs    In the past 12 months, has lack of transportation kept you from medical appointments, meetings, work or from getting things needed for daily living?: 1  Physical Activity: Not at Risk (03/04/2024)   Received from [facility]   Physical Activity    Weekly Physical Activity: 1  Stress: Not on File (02/21/2022)   Received from [facility]   Stress    Stress: 0  Social Connections: Not on File (07/19/2023)   Received from [facility]   Social Connections    Connectedness: 0    Family History  Problem Relation Age of Onset   Hypertension Mother    Cancer Paternal Uncle    Alcohol abuse Neg Hx    Arthritis Neg Hx    Asthma Neg Hx    Birth defects Neg Hx    COPD Neg Hx    Depression Neg Hx    Diabetes Neg Hx    Drug abuse Neg Hx    Early death Neg Hx    Hearing loss Neg Hx    Heart  disease Neg Hx    Hyperlipidemia Neg Hx    Kidney disease Neg Hx    Learning disabilities Neg Hx    Mental illness Neg Hx    Mental retardation Neg Hx    Miscarriages / Stillbirths Neg Hx    Stroke Neg Hx    Vision loss Neg Hx    Other Neg Hx    Breast cancer Neg Hx     Current Outpatient Medications on File Prior to Visit  Medication Sig Dispense Refill   ascorbic acid (VITAMIN C) 1000 MG tablet Take 1,000 mg by mouth daily.     Cholecalciferol (VITAMIN D -3) 1000 UNITS CAPS Take 1 capsule by mouth daily.      Omega-3 Fatty Acids (FISH OIL PO) Take 1 capsule by mouth daily in the afternoon.     No current facility-administered medications on file prior to visit.    No Known Allergies   Objective:   Vitals:   07/14/24 0818  BP: 113/78  Pulse: 67  Weight: 133 lb (60.3 kg)  Height: 5' 1 (1.549 m)   Physical Examination:   General appearance - well appearing, and in no distress  Mental status - alert, oriented to person, place, and time  Psych:  normal mood and affect   Assessment and Plan:  1. Menorrhagia with regular cycle (Primary) Subjectively heavy periods. Hgb and TSH within normal limits on last evaluation and pelvic ultrasound also within normal limits. Reviewed this in detail with the patient. Reviewed perimenopausal symptoms/changes and symptoms that should prompt evaluation. Discussed symptomatic treatment of heavy bleeding however she declines at this time. Return for new or worsening symptoms. Continue to follow up for annual exams.    Rollo ONEIDA Bring, MD, FACOG Obstetrician & Gynecologist, [facility] for [facility], [facility]

## 2024-07-19 ENCOUNTER — Other Ambulatory Visit (HOSPITAL_COMMUNITY): Payer: Self-pay

## 2024-07-19 ENCOUNTER — Telehealth: Payer: Self-pay

## 2024-07-19 NOTE — Telephone Encounter (Signed)
 Pharmacy Patient Advocate Encounter  Insurance verification completed.   The patient is insured through [employer]   Ran test claim for Viread. Currently a quantity of 30 is a 30 day supply and the co-pay is 0.00 . Baraclude $4.00 Vemlidy -NON Formulary  This test claim was processed through [facility]- copay amounts may vary at other pharmacies due to pharmacy/plan contracts, or as the patient moves through the different stages of their insurance plan.

## 2024-07-21 ENCOUNTER — Ambulatory Visit: Admitting: Internal Medicine

## 2024-07-21 ENCOUNTER — Other Ambulatory Visit: Payer: Self-pay

## 2024-07-21 ENCOUNTER — Encounter: Payer: Self-pay | Admitting: Internal Medicine

## 2024-07-21 VITALS — BP 118/85 | HR 68 | Temp 98.2°F | Resp 16 | Wt 123.0 lb

## 2024-07-21 DIAGNOSIS — B181 Chronic viral hepatitis B without delta-agent: Secondary | ICD-10-CM

## 2024-07-21 NOTE — Patient Instructions (Signed)
 You have achieved what we called functional cure (hep b is not active, but had incorporated into our body)   We'll need to find out if you have any advance liver disease; if there is, then we'll need to do cancer screening once or twice a year. But if there is not then nothing needs to be done.   You do not need any medication for hep b at this time

## 2024-07-21 NOTE — Progress Notes (Addendum)
 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 female chinese patient referred by pcp for hep b   Reviewed outside chart and discussed with patient Immigrated from [place] here in the late 1990s Works in Teacher, music Married and has a 75 year old daughter  Her brothers have hep b No one in family with cirrhosis/liver cancer  Patient believe she has had hep b a long time but wonder why it was mentioned now and referred here  She recalled at some point may be 1990s taken some kind of hepatitis b medication and developed a rash.  She has no complaint today  No weight loss, fatigue, abd distension, joint pain, rash No fever, chill No etoh Nonsmoker No ivdu    Review of Systems: ROS All other ros negative      Past Medical History:  Diagnosis Date   Chest pain    Gestational diabetes    diet controlled   No pertinent past medical history     Social History   Tobacco Use   Smoking status: Never   Smokeless tobacco: Never  Vaping Use    Vaping status: Never Used  Substance Use Topics   Alcohol use: Yes    Alcohol/week: 2.0 standard drinks of alcohol    Types: 2 Cans of beer per week   Drug use: No    Family History  Problem Relation Age of Onset   Hypertension Mother    Cancer Paternal Uncle    Alcohol abuse Neg Hx    Arthritis Neg Hx    Asthma Neg Hx    Birth defects Neg Hx    COPD Neg Hx    Depression Neg Hx    Diabetes Neg Hx    Drug abuse Neg Hx    Early death Neg Hx    Hearing loss Neg Hx    Heart disease Neg Hx    Hyperlipidemia Neg Hx    Kidney disease Neg Hx    Learning disabilities Neg Hx    Mental illness Neg Hx    Mental retardation Neg Hx    Miscarriages / Stillbirths Neg Hx    Stroke Neg Hx    Vision loss Neg Hx    Other Neg Hx    Breast cancer Neg Hx     No Known Allergies  OBJECTIVE: Vitals:   07/21/24 0856 07/21/24 0857  BP:  118/85  Pulse:  68  Resp: 16 16  Temp:  98.2 F (36.8 C)  TempSrc:  Oral  SpO2:  97%  Weight: 123 lb (55.8 kg)    Body mass index is 23.24 kg/m.   Physical Exam  General/constitutional: no distress, pleasant HEENT: Normocephalic, PER, Conj Clear, EOMI, Oropharynx clear Neck supple CV: rrr no mrg Lungs: clear to auscultation, normal respiratory effort Abd: Soft, Nontender Ext: no edema Skin: No Rash Neuro: nonfocal MSK: no peripheral joint swelling/tenderness/warmth; back spines nontender   Lab: Outside record ([facility]) 03/04/24 rpr, hiv screen, hcv rna negative; hep b eAb negative, sAg negative, sAb positive (130 mIU/mL), cAb positive  03/04/24 A1c 5.9   Microbiology:  Serology:  Imaging:   Assessment/plan: Problem List Items Addressed This Visit   None Visit Diagnoses       Chronic viral hepatitis B without delta agent and without coma (HCC)    -  Primary   Relevant Orders   US  ABDOMEN COMPLETE W/ELASTOGRAPHY         #hepatitis b -- functional cure 03/2024 primary care team checked and labs showed functional  cure Her brothers have hep b; she reports no one in family have cirrhosis or liver cancer  No obvious clinical sign of cirrhosis although occult cirrhosis can be present and would benefit from workup for this   I discuss role of hcc screening and I'll get a liver u/s with elastography to see if she has advance fibrosis/cirrhosis where I will need to do HCC    -u/s elastography ordered -no labs needed today -w/u one year   -discussed natural progression of hep b, transmission (avoid sharing personal hygiene equipment) -discussed avoid toxin like etoh and excessive acetamaminphen (no more than 2 gram a day) -discussed healthy life style and good glucose control -advise all family members to test once     I have spent a total of 65 minutes of face-to-face and non-face-to-face time, excluding clinical staff time, preparing to see patient, ordering tests and/or medications, and provide counseling the patient    Clinical decision making for complex problem requiring lab interpretation and further diagnostics       Follow-up: Return in about 1 year (around 07/21/2025).  Constance ONEIDA Passer, MD [facility] 07/21/2024, 9:33 AM

## 2024-07-23 ENCOUNTER — Other Ambulatory Visit: Payer: Self-pay

## 2024-07-23 DIAGNOSIS — B181 Chronic viral hepatitis B without delta-agent: Secondary | ICD-10-CM

## 2024-07-27 ENCOUNTER — Ambulatory Visit (HOSPITAL_COMMUNITY)
Admission: RE | Admit: 2024-07-27 | Discharge: 2024-07-27 | Disposition: A | Discharge status: Home / Self Care | Source: Ambulatory Visit | Attending: Internal Medicine | Admitting: Internal Medicine

## 2024-07-27 DIAGNOSIS — B181 Chronic viral hepatitis B without delta-agent: Secondary | ICD-10-CM | POA: Insufficient documentation
